# Patient Record
Sex: Male | Born: 1955 | Race: White | Hispanic: No | Smoking: Never smoker
Health system: Southern US, Community
[De-identification: ages and names within clinical notes are randomized; demographics above are authoritative.]

## PROBLEM LIST (undated history)

## (undated) DIAGNOSIS — K7581 Nonalcoholic steatohepatitis (NASH): Secondary | ICD-10-CM

## (undated) DIAGNOSIS — K279 Peptic ulcer, site unspecified, unspecified as acute or chronic, without hemorrhage or perforation: Secondary | ICD-10-CM

## (undated) DIAGNOSIS — E785 Hyperlipidemia, unspecified: Secondary | ICD-10-CM

## (undated) DIAGNOSIS — D649 Anemia, unspecified: Secondary | ICD-10-CM

## (undated) HISTORY — DX: Anemia, unspecified: D64.9

## (undated) HISTORY — DX: Nonalcoholic steatohepatitis (NASH): K75.81

## (undated) HISTORY — PX: TONSILLECTOMY: SUR1361

## (undated) HISTORY — PX: NASAL SEPTUM SURGERY: SHX37

## (undated) HISTORY — DX: Hyperlipidemia, unspecified: E78.5

## (undated) HISTORY — DX: Peptic ulcer, site unspecified, unspecified as acute or chronic, without hemorrhage or perforation: K27.9

## (undated) HISTORY — PX: UPPER GASTROINTESTINAL ENDOSCOPY: SHX188

---

## 1992-05-25 DIAGNOSIS — K279 Peptic ulcer, site unspecified, unspecified as acute or chronic, without hemorrhage or perforation: Secondary | ICD-10-CM

## 1992-05-25 HISTORY — DX: Peptic ulcer, site unspecified, unspecified as acute or chronic, without hemorrhage or perforation: K27.9

## 1992-05-25 HISTORY — PX: COLONOSCOPY: SHX174

## 1993-05-25 DIAGNOSIS — D649 Anemia, unspecified: Secondary | ICD-10-CM

## 1993-05-25 HISTORY — DX: Anemia, unspecified: D64.9

## 2004-04-08 ENCOUNTER — Ambulatory Visit: Payer: Self-pay | Admitting: Internal Medicine

## 2004-04-24 ENCOUNTER — Ambulatory Visit: Payer: Self-pay | Admitting: Internal Medicine

## 2004-05-02 ENCOUNTER — Ambulatory Visit: Payer: Self-pay | Admitting: Internal Medicine

## 2004-05-23 ENCOUNTER — Ambulatory Visit: Payer: Self-pay | Admitting: Family Medicine

## 2004-07-07 ENCOUNTER — Ambulatory Visit: Payer: Self-pay | Admitting: Internal Medicine

## 2004-07-11 ENCOUNTER — Ambulatory Visit: Payer: Self-pay | Admitting: Internal Medicine

## 2004-07-18 ENCOUNTER — Encounter: Admission: RE | Admit: 2004-07-18 | Discharge: 2004-07-18 | Payer: Self-pay | Admitting: Internal Medicine

## 2004-12-15 ENCOUNTER — Ambulatory Visit: Payer: Self-pay | Admitting: Internal Medicine

## 2005-01-21 ENCOUNTER — Ambulatory Visit: Payer: Self-pay | Admitting: Internal Medicine

## 2005-01-22 ENCOUNTER — Encounter: Admission: RE | Admit: 2005-01-22 | Discharge: 2005-01-22 | Payer: Self-pay | Admitting: Internal Medicine

## 2005-03-19 ENCOUNTER — Ambulatory Visit: Payer: Self-pay | Admitting: Internal Medicine

## 2005-03-23 ENCOUNTER — Ambulatory Visit: Payer: Self-pay | Admitting: Internal Medicine

## 2005-04-13 ENCOUNTER — Ambulatory Visit: Payer: Self-pay | Admitting: Internal Medicine

## 2005-04-28 ENCOUNTER — Encounter: Admission: RE | Admit: 2005-04-28 | Discharge: 2005-07-27 | Payer: Self-pay | Admitting: Internal Medicine

## 2005-05-25 HISTORY — PX: COLONOSCOPY: SHX174

## 2005-07-13 ENCOUNTER — Ambulatory Visit: Payer: Self-pay | Admitting: Internal Medicine

## 2005-08-31 ENCOUNTER — Ambulatory Visit: Payer: Self-pay | Admitting: Internal Medicine

## 2005-09-04 ENCOUNTER — Emergency Department (HOSPITAL_COMMUNITY): Admission: EM | Admit: 2005-09-04 | Discharge: 2005-09-04 | Payer: Self-pay | Admitting: Emergency Medicine

## 2005-09-23 ENCOUNTER — Ambulatory Visit: Payer: Self-pay | Admitting: Gastroenterology

## 2005-09-30 ENCOUNTER — Ambulatory Visit: Payer: Self-pay | Admitting: Gastroenterology

## 2005-10-07 ENCOUNTER — Ambulatory Visit: Payer: Self-pay | Admitting: Gastroenterology

## 2005-10-07 ENCOUNTER — Encounter (INDEPENDENT_AMBULATORY_CARE_PROVIDER_SITE_OTHER): Payer: Self-pay | Admitting: *Deleted

## 2005-11-02 ENCOUNTER — Ambulatory Visit: Payer: Self-pay | Admitting: Internal Medicine

## 2005-11-18 ENCOUNTER — Encounter: Admission: RE | Admit: 2005-11-18 | Discharge: 2006-02-16 | Payer: Self-pay | Admitting: Internal Medicine

## 2005-11-24 ENCOUNTER — Ambulatory Visit: Payer: Self-pay | Admitting: Internal Medicine

## 2005-12-23 ENCOUNTER — Ambulatory Visit: Payer: Self-pay | Admitting: Gastroenterology

## 2006-01-26 ENCOUNTER — Ambulatory Visit: Payer: Self-pay | Admitting: Internal Medicine

## 2006-03-10 ENCOUNTER — Ambulatory Visit: Payer: Self-pay | Admitting: Internal Medicine

## 2006-03-19 ENCOUNTER — Ambulatory Visit: Payer: Self-pay | Admitting: Internal Medicine

## 2006-04-01 ENCOUNTER — Ambulatory Visit: Payer: Self-pay | Admitting: Internal Medicine

## 2006-05-04 ENCOUNTER — Ambulatory Visit: Payer: Self-pay | Admitting: Internal Medicine

## 2006-05-04 LAB — CONVERTED CEMR LAB
ALT: 38 units/L (ref 0–40)
Chol/HDL Ratio, serum: 3.8
Triglyceride fasting, serum: 111 mg/dL (ref 0–149)
VLDL: 22 mg/dL (ref 0–40)

## 2006-09-03 ENCOUNTER — Ambulatory Visit: Payer: Self-pay | Admitting: Internal Medicine

## 2007-01-10 ENCOUNTER — Ambulatory Visit: Payer: Self-pay | Admitting: Internal Medicine

## 2007-01-10 DIAGNOSIS — K900 Celiac disease: Secondary | ICD-10-CM

## 2007-01-14 ENCOUNTER — Ambulatory Visit: Payer: Self-pay | Admitting: Internal Medicine

## 2007-01-24 LAB — CONVERTED CEMR LAB
ALT: 49 units/L (ref 0–53)
AST: 35 units/L (ref 0–37)
Alkaline Phosphatase: 51 units/L (ref 39–117)
Basophils Relative: 0.4 % (ref 0.0–1.0)
Bilirubin, Direct: 0.1 mg/dL (ref 0.0–0.3)
CO2: 26 meq/L (ref 19–32)
Calcium: 9.2 mg/dL (ref 8.4–10.5)
Creatinine, Ser: 1 mg/dL (ref 0.4–1.5)
Eosinophils Relative: 10.2 % — ABNORMAL HIGH (ref 0.0–5.0)
GFR calc Af Amer: 101 mL/min
Glucose, Bld: 91 mg/dL (ref 70–99)
Hemoglobin: 16.5 g/dL (ref 13.0–17.0)
LDL Cholesterol: 79 mg/dL (ref 0–99)
Lymphocytes Relative: 23.7 % (ref 12.0–46.0)
Neutro Abs: 3.4 10*3/uL (ref 1.4–7.7)
Platelets: 274 10*3/uL (ref 150–400)
RDW: 12.3 % (ref 11.5–14.6)
Total Bilirubin: 1.8 mg/dL — ABNORMAL HIGH (ref 0.3–1.2)
Total Protein: 7.2 g/dL (ref 6.0–8.3)
Triglycerides: 77 mg/dL (ref 0–149)
VLDL: 15 mg/dL (ref 0–40)
WBC: 6.2 10*3/uL (ref 4.5–10.5)

## 2007-01-25 ENCOUNTER — Encounter (INDEPENDENT_AMBULATORY_CARE_PROVIDER_SITE_OTHER): Payer: Self-pay | Admitting: *Deleted

## 2007-02-01 ENCOUNTER — Encounter: Payer: Self-pay | Admitting: Internal Medicine

## 2007-05-23 ENCOUNTER — Ambulatory Visit: Payer: Self-pay | Admitting: Internal Medicine

## 2007-05-24 ENCOUNTER — Telehealth: Payer: Self-pay | Admitting: Internal Medicine

## 2007-05-25 ENCOUNTER — Telehealth: Payer: Self-pay | Admitting: Internal Medicine

## 2007-05-25 LAB — CONVERTED CEMR LAB
Amylase: 76 units/L (ref 27–131)
Basophils Absolute: 0 10*3/uL (ref 0.0–0.1)
Eosinophils Absolute: 0.2 10*3/uL (ref 0.0–0.6)
H Pylori IgG: NEGATIVE
Lipase: 38 units/L (ref 11.0–59.0)
Lymphocytes Relative: 19.8 % (ref 12.0–46.0)
MCHC: 36 g/dL (ref 30.0–36.0)
MCV: 87.4 fL (ref 78.0–100.0)
Monocytes Relative: 10.8 % (ref 3.0–11.0)
Neutro Abs: 5.8 10*3/uL (ref 1.4–7.7)
Platelets: 306 10*3/uL (ref 150–400)
RBC: 4.79 M/uL (ref 4.22–5.81)
WBC: 8.6 10*3/uL (ref 4.5–10.5)

## 2007-05-30 ENCOUNTER — Encounter (INDEPENDENT_AMBULATORY_CARE_PROVIDER_SITE_OTHER): Payer: Self-pay | Admitting: *Deleted

## 2007-05-30 ENCOUNTER — Ambulatory Visit: Payer: Self-pay | Admitting: Internal Medicine

## 2007-06-17 ENCOUNTER — Encounter: Payer: Self-pay | Admitting: Internal Medicine

## 2007-10-27 ENCOUNTER — Telehealth (INDEPENDENT_AMBULATORY_CARE_PROVIDER_SITE_OTHER): Payer: Self-pay | Admitting: *Deleted

## 2007-12-20 ENCOUNTER — Emergency Department (HOSPITAL_BASED_OUTPATIENT_CLINIC_OR_DEPARTMENT_OTHER): Admission: EM | Admit: 2007-12-20 | Discharge: 2007-12-20 | Payer: Self-pay | Admitting: Emergency Medicine

## 2007-12-28 ENCOUNTER — Ambulatory Visit: Payer: Self-pay | Admitting: Internal Medicine

## 2007-12-28 LAB — CONVERTED CEMR LAB: HDL goal, serum: 40 mg/dL

## 2007-12-29 ENCOUNTER — Encounter (INDEPENDENT_AMBULATORY_CARE_PROVIDER_SITE_OTHER): Payer: Self-pay | Admitting: *Deleted

## 2008-02-24 ENCOUNTER — Ambulatory Visit: Payer: Self-pay | Admitting: Internal Medicine

## 2008-02-29 ENCOUNTER — Telehealth (INDEPENDENT_AMBULATORY_CARE_PROVIDER_SITE_OTHER): Payer: Self-pay | Admitting: *Deleted

## 2008-07-17 ENCOUNTER — Ambulatory Visit: Payer: Self-pay | Admitting: Internal Medicine

## 2008-12-07 ENCOUNTER — Ambulatory Visit: Payer: Self-pay | Admitting: Family Medicine

## 2008-12-17 ENCOUNTER — Ambulatory Visit: Payer: Self-pay | Admitting: Sports Medicine

## 2008-12-18 ENCOUNTER — Encounter: Payer: Self-pay | Admitting: Sports Medicine

## 2008-12-27 ENCOUNTER — Encounter: Payer: Self-pay | Admitting: Internal Medicine

## 2009-02-21 ENCOUNTER — Telehealth (INDEPENDENT_AMBULATORY_CARE_PROVIDER_SITE_OTHER): Payer: Self-pay | Admitting: *Deleted

## 2009-02-21 ENCOUNTER — Ambulatory Visit: Payer: Self-pay | Admitting: Internal Medicine

## 2009-02-22 ENCOUNTER — Encounter: Payer: Self-pay | Admitting: Internal Medicine

## 2009-02-28 ENCOUNTER — Telehealth (INDEPENDENT_AMBULATORY_CARE_PROVIDER_SITE_OTHER): Payer: Self-pay | Admitting: *Deleted

## 2009-03-14 ENCOUNTER — Ambulatory Visit: Payer: Self-pay | Admitting: Sports Medicine

## 2009-04-04 ENCOUNTER — Ambulatory Visit: Payer: Self-pay | Admitting: Sports Medicine

## 2009-05-28 ENCOUNTER — Ambulatory Visit: Payer: Self-pay | Admitting: Internal Medicine

## 2009-06-20 ENCOUNTER — Ambulatory Visit: Payer: Self-pay | Admitting: Internal Medicine

## 2009-06-20 DIAGNOSIS — M109 Gout, unspecified: Secondary | ICD-10-CM

## 2009-06-20 DIAGNOSIS — R740 Nonspecific elevation of levels of transaminase and lactic acid dehydrogenase [LDH]: Secondary | ICD-10-CM

## 2009-06-20 DIAGNOSIS — J45909 Unspecified asthma, uncomplicated: Secondary | ICD-10-CM

## 2009-06-20 DIAGNOSIS — R7402 Elevation of levels of lactic acid dehydrogenase (LDH): Secondary | ICD-10-CM | POA: Insufficient documentation

## 2009-06-20 DIAGNOSIS — E785 Hyperlipidemia, unspecified: Secondary | ICD-10-CM

## 2009-07-18 ENCOUNTER — Ambulatory Visit: Payer: Self-pay | Admitting: Family Medicine

## 2009-09-26 ENCOUNTER — Ambulatory Visit: Payer: Self-pay | Admitting: Internal Medicine

## 2009-09-26 LAB — CONVERTED CEMR LAB
Albumin: 4.4 g/dL (ref 3.5–5.2)
Cholesterol: 155 mg/dL (ref 0–200)
HDL: 33.2 mg/dL — ABNORMAL LOW (ref >39.00)
Total Protein: 7 g/dL (ref 6.0–8.3)
Triglycerides: 71 mg/dL (ref 0.0–149.0)
Uric Acid, Serum: 5.2 mg/dL (ref 4.0–7.8)

## 2009-10-02 ENCOUNTER — Ambulatory Visit: Payer: Self-pay | Admitting: Internal Medicine

## 2010-01-30 ENCOUNTER — Ambulatory Visit: Payer: Self-pay | Admitting: Internal Medicine

## 2010-03-17 ENCOUNTER — Ambulatory Visit: Payer: Self-pay | Admitting: Sports Medicine

## 2010-03-17 DIAGNOSIS — S4350XA Sprain of unspecified acromioclavicular joint, initial encounter: Secondary | ICD-10-CM | POA: Insufficient documentation

## 2010-03-31 ENCOUNTER — Ambulatory Visit: Payer: Self-pay | Admitting: Family Medicine

## 2010-04-07 ENCOUNTER — Encounter (INDEPENDENT_AMBULATORY_CARE_PROVIDER_SITE_OTHER): Payer: Self-pay | Admitting: *Deleted

## 2010-05-12 ENCOUNTER — Ambulatory Visit: Payer: Self-pay | Admitting: Internal Medicine

## 2010-05-16 ENCOUNTER — Telehealth: Payer: Self-pay | Admitting: Internal Medicine

## 2010-05-28 ENCOUNTER — Encounter: Payer: Self-pay | Admitting: Family Medicine

## 2010-06-19 ENCOUNTER — Ambulatory Visit
Admission: RE | Admit: 2010-06-19 | Discharge: 2010-06-19 | Payer: Self-pay | Source: Home / Self Care | Attending: Family Medicine | Admitting: Family Medicine

## 2010-06-22 LAB — CONVERTED CEMR LAB
ALT: 79 units/L — ABNORMAL HIGH (ref 0–53)
Alkaline Phosphatase: 53 units/L (ref 39–117)
BUN: 15 mg/dL (ref 6–23)
Basophils Absolute: 0.1 10*3/uL (ref 0.0–0.1)
Bilirubin, Direct: 0.1 mg/dL (ref 0.0–0.3)
Chloride: 105 meq/L (ref 96–112)
Creatinine, Ser: 1.1 mg/dL (ref 0.4–1.5)
Eosinophils Absolute: 0.3 10*3/uL (ref 0.0–0.7)
GFR calc non Af Amer: 74.21 mL/min (ref >60)
Glucose, Bld: 85 mg/dL (ref 70–99)
HCT: 48.3 % (ref 39.0–52.0)
Hemoglobin: 15.8 g/dL (ref 13.0–17.0)
Lymphs Abs: 1.9 10*3/uL (ref 0.7–4.0)
MCHC: 32.7 g/dL (ref 30.0–36.0)
Neutro Abs: 3.9 10*3/uL (ref 1.4–7.7)
PSA: 0.66 ng/mL (ref 0.10–4.00)
Platelets: 274 10*3/uL (ref 150.0–400.0)
Potassium: 3.7 meq/L (ref 3.5–5.1)
RDW: 12.7 % (ref 11.5–14.6)
TSH: 2.23 microintl units/mL (ref 0.35–5.50)
Total Bilirubin: 1.9 mg/dL — ABNORMAL HIGH (ref 0.3–1.2)
Total Protein: 7.3 g/dL (ref 6.0–8.3)

## 2010-06-24 NOTE — Assessment & Plan Note (Signed)
Summary: FU SEPARATED SHOULDER/MJD   Vital Signs:  Patient profile:   55 year old male Pulse rate:   98 / minute BP sitting:   132 / 91  (right arm)  Vitals Entered By: Rochele Pages RN (March 31, 2010 3:55 PM) CC: f/u right separated shoulder   Primary Care Provider:  Alwyn Ren  CC:  f/u right separated shoulder.  History of Present Illness: Right shoulder injury (AC separation) Has been wearing sling 80%  of time and is 75% better. Still not sleeping really well at night. usually takes 2 otc ibuprofen, if he tales a larger dose or more often he gets GI upset.   Swelling and bruising resolved--using the arm some.  Habits & Providers  Alcohol-Tobacco-Diet     Tobacco Status: never  Allergies: No Known Drug Allergies  Review of Systems  The patient denies fever.    Physical Exam  General:  alert and well-developed.   Additional Exam:  RIGHT shoulder TTP AC joint. no bruising or warmth. FROM of shooulder and intact strength all plabnes rotator cuff.  Distally neurovascularly intact. Small amount swelling right over [facility] joint but otherwise clavicles are symmetrical.   Impression & Recommendations:  Problem # 1:  ACROMIOCLAVICULAR SPRAIN AND STRAIN (ICD-840.0) start to wean out of sling, tramodol for night pain. Will set up PT beginning 7 days from now--want to get full healing before stressing it. RTC as needed.  Complete Medication List: 1)  Asa 81mg   2)  Vit C Bid  3)  Allopurinol 300 Mg Tabs (Allopurinol) .Marland Kitchen.. 1 once daily 4)  Singulair 10 Mg Tabs (Montelukast sodium) .Marland Kitchen.. 1 by mouth once daily as needed 5)  Advair Diskus 500-50 Mcg/dose Aepb (Fluticasone-salmeterol) .... As needed 6)  Ventolin Hfa 108 (90 Base) Mcg/act Aers (Albuterol sulfate) .... As needed 7)  Colcrys 0.6 Mg Tabs (Colchicine) .Marland Kitchen.. 1 two times a day as needed 8)  Tramadol Hcl 50 Mg Tabs (Tramadol hcl) .... 40 Prescriptions: TRAMADOL HCL 50 MG TABS (TRAMADOL HCL) 40  #1-2 po qhs x 0   Entered and  Authorized by:   Denny Levy MD   Signed by:   Denny Levy MD on 04/01/2010   Method used:   Handwritten   RxID:   [id number]    Orders Added: 1)  Est. Patient Level III [30865]

## 2010-06-24 NOTE — Letter (Signed)
Summary: Cancer Screening/[facility] Personalized Risk Profile  Cancer Screening/[facility] Personalized Risk Profile   Imported By: Lanelle Bal 06/27/2009 13:51:11  _____________________________________________________________________  External Attachment:    Type:   Image     Comment:   External Document

## 2010-06-24 NOTE — Assessment & Plan Note (Signed)
Summary: bumps on penis/cbs   Vital Signs:  Patient profile:   55 year old male Weight:      170.4 pounds Temp:     98.4 degrees F oral Pulse rate:   76 / minute Resp:     14 per minute BP sitting:   110 / 70  (left arm) Cuff size:   large  Vitals Entered By: Shonna Chock CMA (January 30, 2010 8:14 AM) CC: Examine below waist   Primary Care Provider:  Alwyn Ren  CC:  Examine below waist.  History of Present Illness: Papules which have appeared in  past 2 months , more noticable in past couple of weeks. One has increased in size & is  more firm. No change in color ; no associated constitutional symptoms. No PMH of  skin cancer; his mother had melanoma.  Current Medications (verified): 1)  Asa 81mg  2)  Vit C Bid 3)  Allopurinol 300 Mg  Tabs (Allopurinol) .Marland Kitchen.. 1 Once Daily 4)  Singulair 10 Mg Tabs (Montelukast Sodium) .Marland Kitchen.. 1 By Mouth Once Daily As Needed 5)  Advair Diskus 500-50 Mcg/dose Aepb (Fluticasone-Salmeterol) .... As Needed 6)  Ventolin Hfa 108 (90 Base) Mcg/act Aers (Albuterol Sulfate) .... As Needed 7)  Colcrys 0.6 Mg Tabs (Colchicine) .Marland Kitchen.. 1 Two Times A Day As Needed  Allergies (verified): No Known Drug Allergies  Review of Systems General:  Denies chills, fever, sweats, and weight loss. Derm:  Denies changes in color of skin and itching.  Physical Exam  General:  well-nourished; alert,appropriate and cooperative throughout examination Abdomen:  Bowel sounds positive,abdomen soft and non-tender without masses, organomegaly or hernias noted. Genitalia:   No penis lesions or urethral discharge other than papule. Skin:  Small papule on penile shaft w/o color change. Tiny white papule also Cervical Nodes:  No lymphadenopathy noted Axillary Nodes:  No palpable lymphadenopathy Inguinal Nodes:  No significant adenopathy   Impression & Recommendations:  Problem # 1:  OTHER SPECIFIED DISORDER OF SKIN (ICD-709.8) inspissated pore  Complete Medication List: 1)   Asa 81mg   2)  Vit C Bid  3)  Allopurinol 300 Mg Tabs (Allopurinol) .Marland Kitchen.. 1 once daily 4)  Singulair 10 Mg Tabs (Montelukast sodium) .Marland Kitchen.. 1 by mouth once daily as needed 5)  Advair Diskus 500-50 Mcg/dose Aepb (Fluticasone-salmeterol) .... As needed 6)  Ventolin Hfa 108 (90 Base) Mcg/act Aers (Albuterol sulfate) .... As needed 7)  Colcrys 0.6 Mg Tabs (Colchicine) .Marland Kitchen.. 1 two times a day as needed  Patient Instructions: 1)  Report change in color or size of lesion or associated signs of secondary infection.

## 2010-06-24 NOTE — Assessment & Plan Note (Signed)
Summary: CPX//PH   Vital Signs:  Patient profile:   55 year old male Height:      70.5 inches Weight:      184.8 pounds BMI:     26.24 Temp:     98.3 degrees F oral Pulse rate:   87 / minute Resp:     14 per minute BP sitting:   141 / 88  (left arm)  Vitals Entered By: Doristine Devoid (June 20, 2009 1:38 PM)  CC:  CPX, General Medical Evaluation   CC:   CPX and General Medical Evaluation.  History of Present Illness: Mr Lanigan is here  for a physical ; he is asymptomatic.  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 1995 asthma as child, essential  resolution in teens Hyperlipidemia: 97(1648/620)HDL 37,TG 86 Gout NASH, PMH of  Past Surgical History: Deviated septum repair;  Endoscopy: HH, gastritis 2007; PUD @ [facility] 38 ([place])  Family History: Father: HTN, MI @ 64, CABG, gout Mother: breast CA Siblings: none ; Paternal FH depression; Maternal FH alcoholism  Social History: 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         Weighgt loss of 15# over past  year with TLC. Resp:  Denies cough, shortness of breath, and sputum productive; Rare wheezing , esp with cold (EIB). MDI rarely used. No recurrent PNDyspnea.. MS:  Complains of stiffness; denies joint pain, joint redness, joint swelling, low back pain, mid  back pain, and thoracic pain; Last gout flare in 2010.  Physical Exam  General:  Thin but well-nourished,in no acute distress; alert,appropriate and cooperative throughout examination Head:  Normocephalic and atraumatic without obvious abnormalities. No apparent alopecia . Moustache & goatee Eyes:  No corneal or conjunctival inflammation noted. Perrla. Funduscopic exam benign, without hemorrhages, exudates or papilledema.  Ears:  External ear exam shows no significant lesions or deformities.  Otoscopic examination reveals clear canals, tympanic membranes are intact bilaterally without bulging, retraction, inflammation or discharge. Hearing is grossly normal bilaterally. Nose:  External nasal examination shows no deformity or inflammation. Nasal mucosa are pink and moist without lesions or exudates. Mouth:  Oral mucosa and oropharynx without lesions or exudates.  Teeth in good repair. Neck:  No deformities, masses, or tenderness noted. R lobe > L w/o nodules Lungs:  Normal respiratory effort, chest expands symmetrically. Lungs are clear to auscultation, no crackles or wheezes. Heart:  Normal rate and regular rhythm. S1 and S2 normal without gallop, murmur, click, rub. S4 Abdomen:  Bowel sounds positive,abdomen soft and non-tender without masses, organomegaly or hernias noted. Rectal:  No external abnormalities noted. Normal sphincter tone. No rectal masses or tenderness. Genitalia:  Testes bilaterally descended without  tenderness or masses.  No penis lesions or urethral discharge. Minor granuloma R epididymal area Prostate:  Prostate gland firm and smooth, no enlargement, nodularity, tenderness, mass, asymmetry or induration. Msk:  No deformity or scoliosis noted of thoracic or lumbar spine.   Pulses:  R and L carotid,radial,dorsalis pedis and posterior  tibial pulses are full and equal bilaterally Extremities:  No clubbing, cyanosis, edema, or deformity noted with normal full range of motion of  all joints.  Olecranon bursa granulomatous changes , R > L Neurologic:  alert & oriented X3, gait normal, and DTRs symmetrical and normal.   Skin:  Intact without suspicious lesions or rashes Cervical Nodes:  No lymphadenopathy noted Axillary Nodes:  No palpable lymphadenopathy Inguinal Nodes:  No significant adenopathy Psych:  memory intact for recent and remote, normally interactive, and good eye contact.     Impression & Recommendations:  Problem # 1:  ROUTINE GENERAL MEDICAL EXAM@HEALTH  CARE FACL (ICD-V70.0)  Orders: EKG w/ Interpretation (93000)  Problem # 2:  HYPERLIPIDEMIA (ICD-272.4)  His updated medication list for this problem includes:    Vytorin 10-20 Mg Tabs (Ezetimibe-simvastatin) .Marland Kitchen... 1/2 tab qd  Orders: EKG w/ Interpretation (93000)  Problem # 3:  GOUT (ICD-274.9)  His updated medication list for this problem includes:    Allopurinol 300 Mg Tabs (Allopurinol) .Marland Kitchen... 1 once daily    Colchicine 0.6 Mg Tabs (Colchicine) .Marland Kitchen... Take one tablet every 4 hours as needed  Problem # 4:  CELIAC SPRUE (ICD-579.0)  Problem # 5:  GILBERT'S SYNDROME (ICD-277.4)  Problem # 6:  NONSPEC ELEVATION OF LEVELS OF TRANSAMINASE/LDH (ICD-790.4) PMH of NASH  Problem # 7:  ASTHMA, UNSPECIFIED, UNSPECIFIED STATUS (ICD-493.90) quiescent His updated medication list for this problem includes:    Singulair 10 Mg Tabs (Montelukast sodium) .Marland Kitchen... 1 by mouth once daily as needed    Qvar 80 Mcg/act Aers (Beclomethasone dipropionate) .Marland Kitchen... 1-2 puffs q 12 hrs ; gargle after use  Complete Medication List: 1)  Vytorin 10-20 Mg Tabs (Ezetimibe-simvastatin) .... 1/2 tab qd 2)  Asa 81mg   3)  Vit C Bid  4)  Allopurinol 300 Mg Tabs (Allopurinol) .Marland Kitchen.. 1 once daily 5)  Singulair 10 Mg Tabs (Montelukast sodium) .Marland Kitchen.. 1 by mouth once daily as needed 6)  Colchicine 0.6 Mg Tabs (Colchicine) .... Take one tablet every 4 hours as needed 7)  Fexofenadine Hcl 180 Mg Tabs (Fexofenadine hcl) .Marland Kitchen.. 1 once daily  as needed allergic symptoms 8)  Qvar 80 Mcg/act Aers (Beclomethasone dipropionate) .Marland Kitchen.. 1-2 puffs q 12 hrs ; gargle after use  Patient Instructions: 1)  Avoid vit A > 5000 units/ day. Complex carb ingestion instead of white carbs. Avoid ALL foods & drinks with High Fructose Corn Syrup as #1,2 or #3 on label. 2)  Please schedule a follow-up appointment in 4-6  months. 3)  Hepatic Panel, uric acid;Lipid Panel . Minimal LDL goal = < 100, ideally < 80 Prescriptions: ALLOPURINOL 300 MG  TABS (ALLOPURINOL) 1 once daily  #90 x 1   Entered and Authorized by:   Marga Melnick MD   Signed by:   Marga Melnick MD on 06/20/2009   Method used:   Faxed to ...       [facility] [address] (retail)       [address]       [place], [place]  [address]       Ph: [phone] or [phone]       Fax: [phone]   RxID:   [id number]    Immunization History:  Tetanus/Td Immunization History:    Tetanus/Td:  historical (04/01/2006)

## 2010-06-24 NOTE — Assessment & Plan Note (Signed)
Summary: wheezing/cough/kdc   Vital Signs:  Patient profile:   55 year old male Weight:      188 pounds O2 Sat:      96 % Temp:     98.1 degrees F oral Pulse rate:   119 / minute BP sitting:   138 / 86  (left arm) Cuff size:   large  Vitals Entered By: Shonna Chock (July 18, 2009 2:03 PM) CC: Cough & Wheezing x 1 week Comments REVIEWED MED LIST, PATIENT AGREED DOSE AND INSTRUCTION CORRECT    History of Present Illness:  Cough      This is a 55 year old man who presents with Cough.  The symptoms began 1 week ago.  The patient reports productive cough, wheezing, and exertional dyspnea, but denies non-productive cough, pleuritic chest pain, shortness of breath, fever, hemoptysis, and malaise.  Associated symtpoms include nasal congestion.  The patient denies the following symptoms: cold/URI symptoms, sore throat, chronic rhinitis, weight loss, acid reflux symptoms, and peripheral edema.  The cough is worse with exercise.  Ineffective prior treatments have included albuterol inhaler and other asthma medication.  Pt is using mucinex with some relief.  Pt using albuterol 2 x a day and advair 500 2x a day.      Allergies (verified): No Known Drug Allergies  Past History:  Past medical, surgical, family and social histories (including risk factors) reviewed for relevance to current acute and chronic problems.  Past Medical History: Reviewed history from 06/20/2009 and no changes required. 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 Deviated septum repair;  Endoscopy: HH, gastritis 2007; PUD @ Endo 1994 ([place])  Family History: Reviewed history from 06/20/2009 and no changes required. Father: HTN, MI @ 42, CABG, gout Mother: breast CA Siblings: none ; Paternal FH depression; Maternal  FH alcoholism  Social History: Reviewed history from 06/20/2009 and no changes required. 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  Otoscopic examination reveals clear canals, tympanic membranes are intact bilaterally without bulging, retraction, inflammation or discharge. Hearing is grossly normal bilaterally. Nose:  External nasal examination shows no deformity or inflammation. Nasal mucosa are pink and moist without lesions or exudates. Mouth:  Oral mucosa and oropharynx without lesions or exudates.  Teeth in good repair. Neck:  No deformities, masses, or tenderness noted. Lungs:  R decreased breath sounds and L decreased breath sounds.  ---improved with neb Heart:  Normal rate and regular rhythm. S1 and S2 normal without gallop, murmur, click, rub or other extra sounds. Cervical Nodes:  No lymphadenopathy noted Psych:  Cognition and judgment appear intact. Alert and cooperative with normal attention span and concentration. No apparent delusions, illusions, hallucinations   Impression & Recommendations:  Problem # 1:  BRONCHITIS- ACUTE (ICD-466.0)  The following medications were removed from the medication list:    Qvar 80 Mcg/act Aers (Beclomethasone dipropionate) .Marland Kitchen... 1-2 puffs q 12 hrs ; gargle after use His updated medication list for this problem includes:    Singulair 10 Mg Tabs (Montelukast sodium) .Marland Kitchen... 1 by mouth once daily as needed  Advair Diskus 500-50 Mcg/dose Aepb (Fluticasone-salmeterol) .Marland Kitchen... As needed    Ventolin Hfa 108 (90 Base) Mcg/act Aers (Albuterol sulfate) .Marland Kitchen... As needed    Zithromax Z-pak 250 Mg Tabs (Azithromycin) .Marland Kitchen... As directed  Take  antibiotics and other medications as directed. Encouraged to push clear liquids, get enough rest, and take acetaminophen as needed. To be seen in 5-7 days if no improvement, sooner if worse.  Complete Medication List: 1)  Vytorin 10-20 Mg Tabs (Ezetimibe-simvastatin) .... 1/2 tab qd 2)  Asa 81mg   3)  Vit C Bid  4)  Allopurinol 300 Mg Tabs (Allopurinol) .Marland Kitchen.. 1 once daily 5)  Singulair 10 Mg Tabs (Montelukast sodium) .Marland Kitchen.. 1 by mouth once daily as needed 6)  Colchicine 0.6 Mg Tabs (Colchicine) .... Take one tablet every 4 hours as needed 7)  Fexofenadine Hcl 180 Mg Tabs (Fexofenadine hcl) .Marland Kitchen.. 1 once daily as needed allergic symptoms 8)  Advair Diskus 500-50 Mcg/dose Aepb (Fluticasone-salmeterol) .... As needed 9)  Ventolin Hfa 108 (90 Base) Mcg/act Aers (Albuterol sulfate) .... As needed 10)  Zithromax Z-pak 250 Mg Tabs (Azithromycin) .... As directed 11)  Prednisone 10 Mg Tabs (Prednisone) .... 3 by mouth once daily for 3 days then 2 by mouth once daily for 3 days then 1 by mouth once daily for 3 days  Other Orders: Admin of Therapeutic Inj  intramuscular or subcutaneous (30865) Depo- Medrol 80mg  (J1040) Nebulizer Tx (78469) Albuterol Sulfate Sol 1mg  unit dose (G2952) Prescriptions: PREDNISONE 10 MG TABS (PREDNISONE) 3 by mouth once daily for 3 days then 2 by mouth once daily for 3 days then 1 by mouth once daily for 3 days  #18 x 0   Entered and Authorized by:   Loreen Freud DO   Signed by:   Loreen Freud DO on 07/18/2009   Method used:   Electronically to        [facility] [phone] ([facility]       [address]       [place], [place]  [address]       Ph: [phone] or [phone]       Fax: [phone]   RxID:   [id number] ZITHROMAX Z-PAK 250 MG TABS (AZITHROMYCIN) as directed  #1 x 0   Entered and Authorized by:   Loreen Freud DO   Signed by:   Loreen Freud DO on 07/18/2009   Method used:   Electronically to        [facility] [phone]* (retail)       [address]        [place], [place]  [address]       Ph: [phone] or [phone]       Fax: [phone]   RxID:   [id number] VENTOLIN HFA 108 (90 BASE) MCG/ACT AERS (ALBUTEROL SULFATE) as needed  #1 x 1   Entered and Authorized by:   Loreen Freud DO   Signed by:   Loreen Freud DO on 07/18/2009   Method used:   Electronically to        [facility] [phone][facility] (retail)       [address]       [place], [place]  [address]       Ph: [phone] or [phone]       Fax: [phone]   RxID:   [id number] ADVAIR DISKUS 500-50 MCG/DOSE AEPB (FLUTICASONE-SALMETEROL) as needed  #1 x 2   Entered and Authorized by:   Loreen Freud DO   Signed  by:   Loreen Freud DO on 07/18/2009   Method used:   Electronically to        [facility] [phone][facility] (retail)       [address]       [place], [place]  [address]       Ph: [phone] or [phone]       Fax: [phone]   RxID:   [id number]      Medication Administration  Injection # 1:    Medication: Depo- Medrol 80mg     Diagnosis: ASTHMA, UNSPECIFIED, UNSPECIFIED STATUS (ICD-493.90)    Route: IM    Site: RUOQ gluteus    Exp Date: 03/2010    Lot #: [id number]    Mfr: novaplus    Patient tolerated injection without complications    Given by: Army Fossa CMA (July 18, 2009 2:27 PM)  Medication # 1:    Medication: Albuterol Sulfate Sol 1mg  unit dose    Diagnosis: ASTHMA, UNSPECIFIED, UNSPECIFIED STATUS (ICD-493.90)  Orders Added: 1)  Admin of Therapeutic Inj  intramuscular or subcutaneous [96372] 2)  Depo- Medrol 80mg  [J1040] 3)  Nebulizer Tx [59563] 4)  Albuterol Sulfate Sol 1mg  unit dose [J7613] 5)  Est. Patient Level IV [87564]

## 2010-06-24 NOTE — Letter (Signed)
Summary: [facility] PT referral form  [facility] PT referral form   Imported By: Marily Memos 04/01/2010 11:38:21  _____________________________________________________________________  External Attachment:    Type:   Image     Comment:   External Document

## 2010-06-24 NOTE — Assessment & Plan Note (Signed)
Summary: congested/cbs   Vital Signs:  Patient profile:   55 year old male Weight:      185 pounds Temp:     98.5 degrees F oral Pulse rate:   76 / minute Resp:     14 per minute BP sitting:   120 / 70  (left arm) Cuff size:   large  Vitals Entered By: Shonna Chock (Oct 02, 2009 10:47 AM) CC: 1.) Congestion/ ? Allergies x 2-3 days  2.) Discuss Labs -off Vytorin x 3 months, Lipid Management Comments REVIEWED MED LIST, PATIENT AGREED DOSE AND INSTRUCTION CORRECT    Primary Care Provider:  Alwyn Ren  CC:  1.) Congestion/ ? Allergies x 2-3 days  2.) Discuss Labs -off Vytorin x 3 months and Lipid Management.  History of Present Illness: Labs reviewed & risks discussed. ALT is now 54 & LDL 108. NMR reviewed ; LDL goal < 80 based on that study but < 130 as per [facility]. No premature MI in FH.  PMH of NASH .CVE 3-4X/week w/o symptoms.                                                                                                                                                         Onset of ST, throat  & head congestion  & NP cough as of 05/09. Rx: Dayquil, Echinacea, Nyquil. He is flying to [place] this afternoon.  Lipid Management History:      Positive NCEP/ATP III risk factors include male age 57 years old or older and HDL cholesterol less than 40.  Negative NCEP/ATP III risk factors include non-diabetic, no family history for ischemic heart disease, non-tobacco-user status, non-hypertensive, no ASHD (atherosclerotic heart disease), no prior stroke/TIA, no peripheral vascular disease, and no history of aortic aneurysm.     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 [place] LDL goal = < 130. NMR Panel goal =< 80. Gout NASH, PMH of  Review of Systems General:  Denies chills, fever, and sweats. ENT:   Complains of nasal congestion; denies ear discharge, earache, and sinus pressure; No frontal headache , facial pain or purulence. CV:  Denies chest pain or discomfort, leg cramps with exertion, and shortness of breath with exertion. Resp:  Complains of shortness of breath and wheezing; denies chest pain with inspiration and sputum productive; Advair restarted 2 days ago; using Albuterol 2-3 X/ day. Allergy:  Complains of sneezing; denies itching eyes.  Physical Exam  General:  in no acute distress; alert,appropriate and cooperative throughout examination Ears:  External ear exam shows no significant lesions or deformities.  Otoscopic examination reveals clear canals, tympanic membranes are intact bilaterally without bulging, retraction, inflammation  or discharge. Hearing is grossly normal bilaterally. Nose:  External nasal examination shows no deformity or inflammation. Nasal mucosa are dry with septal deviation to R without lesions or exudates. Mouth:  Oral mucosa and oropharynx without lesions or exudates.  Teeth in good repair.R hypophraynx lower than L Lungs:  Normal respiratory effort, chest expands symmetrically. Lungs : minimal rales &  wheezes. Heart:  regular rhythm, no murmur, no gallop, no rub, no JVD, and bradycardia.  S4 Skin:  Intact without suspicious lesions or rashes Cervical Nodes:  No lymphadenopathy noted Axillary Nodes:  No palpable lymphadenopathy   Impression & Recommendations:  Problem # 1:  BRONCHITIS-ACUTE (ICD-466.0) with mild RAD The following medications were removed from the medication list:    Zithromax Z-pak 250 Mg Tabs (Azithromycin) .Marland Kitchen... As directed His updated medication list for this problem includes:    Singulair 10 Mg Tabs (Montelukast sodium) .Marland Kitchen... 1 by mouth once daily as needed    Advair Diskus 500-50 Mcg/dose Aepb (Fluticasone-salmeterol) .Marland Kitchen... As needed    Ventolin Hfa 108 (90 Base) Mcg/act Aers (Albuterol sulfate) .Marland Kitchen... As needed     Azithromycin 250 Mg Tabs (Azithromycin) .Marland Kitchen... As per pack  Problem # 2:  URI (ICD-465.9)  The following medications were removed from the medication list:    Fexofenadine Hcl 180 Mg Tabs (Fexofenadine hcl) .Marland Kitchen... 1 once daily as needed allergic symptoms  Problem # 3:  HYPERLIPIDEMIA (ICD-272.4) LDL 108 after 3 mos off statin The following medications were removed from the medication list:    Vytorin 10-20 Mg Tabs (Ezetimibe-simvastatin) .Marland Kitchen... 1/2 tab qd  Problem # 4:  GOUT (ICD-274.9) PMH of, uric acid @ goal The following medications were removed from the medication list:    Colchicine 0.6 Mg Tabs (Colchicine) .Marland Kitchen... Take one tablet every 4 hours as needed His updated medication list for this problem includes:    Allopurinol 300 Mg Tabs (Allopurinol) .Marland Kitchen... 1 once daily    Colcrys 0.6 Mg Tabs (Colchicine) .Marland Kitchen... 1 two times a day as needed  Complete Medication List: 1)  Asa 81mg   2)  Vit C Bid  3)  Allopurinol 300 Mg Tabs (Allopurinol) .Marland Kitchen.. 1 once daily 4)  Singulair 10 Mg Tabs (Montelukast sodium) .Marland Kitchen.. 1 by mouth once daily as needed 5)  Advair Diskus 500-50 Mcg/dose Aepb (Fluticasone-salmeterol) .... As needed 6)  Ventolin Hfa 108 (90 Base) Mcg/act Aers (Albuterol sulfate) .... As needed 7)  Colcrys 0.6 Mg Tabs (Colchicine) .Marland Kitchen.. 1 two times a day as needed 8)  Azithromycin 250 Mg Tabs (Azithromycin) .... As per pack  Lipid Assessment/Plan:      Based on NCEP/ATP III, the patient's risk factor category is "2 or more risk factors and a calculated 10 year CAD risk of < 20%".  The patient's lipid goals are as follows: Total cholesterol goal is 200; LDL cholesterol goal is 75; HDL cholesterol goal is 40; Triglyceride goal is 150.  His LDL cholesterol goal has been met.    Patient Instructions: 1)  Colcrys two times a day as needed only for gout.Use Advair 500/50 two times a day; gargle & spit after use. Consume < 40 grams of sugar/ day from High Fructose Corn Syrup Sources. 2)  Please  schedule a follow-up appointment in 1 year. 3)  It is important that you exercise regularly at least 20 minutes 5 times a week. If you develop chest pain, have severe difficulty breathing, or feel very tired , stop exercising immediately and seek medical attention.Take an Aspirin every  day.Hepatic Panel prior to visit, ICD-9:794.5;uric acid : 274.9;NMR Lipoprofile Lipid Panel prior to visit, ICD-9:272.4, V17.3. Prescriptions: AZITHROMYCIN 250 MG TABS (AZITHROMYCIN) as per pack  #1 x 0   Entered and Authorized by:   Marga Melnick MD   Signed by:   Marga Melnick MD on 10/02/2009   Method used:   Print then Give to Patient   RxID:   [id number] COLCRYS 0.6 MG TABS (COLCHICINE) 1 two times a day as needed  #30 x 1   Entered and Authorized by:   Marga Melnick MD   Signed by:   Marga Melnick MD on 10/02/2009   Method used:   Print then Give to Patient   RxID:   [id number]

## 2010-06-24 NOTE — Miscellaneous (Signed)
  Clinical Lists Changes   [facility] at  [phone]; done. Lillia Pauls CMA  April 07, 2010 3:38 PM

## 2010-06-24 NOTE — Assessment & Plan Note (Signed)
Summary: 4:15 APPT,SHOULDER INJURY,MC   Vital Signs:  Patient profile:   55 year old male BP sitting:   130 / 90  Vitals Entered By: Rochele Pages RN (March 17, 2010 4:25 PM)  Primary Care Provider:  Alwyn Ren   History of Present Illness: RIGHT shoulder injury 5 dyas ago--fell onto right shoulder. Immediate pain. [facility] ([facility]) and had x ray. dx with CA joint separation. No numbness or tingling in his hand or arm. ,ppmh no prior shulder injury or surgery,  Current Medications (verified): 1)  Asa 81mg  2)  Vit C Bid 3)  Allopurinol 300 Mg  Tabs (Allopurinol) .Marland Kitchen.. 1 Once Daily 4)  Singulair 10 Mg Tabs (Montelukast Sodium) .Marland Kitchen.. 1 By Mouth Once Daily As Needed 5)  Advair Diskus 500-50 Mcg/dose Aepb (Fluticasone-Salmeterol) .... As Needed 6)  Ventolin Hfa 108 (90 Base) Mcg/act Aers (Albuterol Sulfate) .... As Needed 7)  Colcrys 0.6 Mg Tabs (Colchicine) .Marland Kitchen.. 1 Two Times A Day As Needed  Allergies: No Known Drug Allergies  Review of Systems  The patient denies fever.         Marland Kitchenp   Physical Exam  General:  alert, well-developed, and well-nourished.   Neck:  supple.   Msk:  right AC joint swollen and ttp. No redness or warmth. SHOULDER FROm, intact otator cuff muscles Distally NV intact Skin:  echy shoulder / neck area   Impression & Recommendations:  Problem # 1:  ACROMIOCLAVICULAR SPRAIN AND STRAIN (ICD-840.0) sling for comfort with ROm exercises throghoutte day.RTC 2 weeks fr f/u.  Complete Medication List: 1)  Asa 81mg   2)  Vit C Bid  3)  Allopurinol 300 Mg Tabs (Allopurinol) .Marland Kitchen.. 1 once daily 4)  Singulair 10 Mg Tabs (Montelukast sodium) .Marland Kitchen.. 1 by mouth once daily as needed 5)  Advair Diskus 500-50 Mcg/dose Aepb (Fluticasone-salmeterol) .... As needed 6)  Ventolin Hfa 108 (90 Base) Mcg/act Aers (Albuterol sulfate) .... As needed 7)  Colcrys 0.6 Mg Tabs (Colchicine) .Marland Kitchen.. 1 two times a day as needed   Orders Added: 1)  New Patient Level II [99202]     Complete  Medication List: 1)  Asa 81mg   2)  Vit C Bid  3)  Allopurinol 300 Mg Tabs (Allopurinol) .Marland Kitchen.. 1 once daily 4)  Singulair 10 Mg Tabs (Montelukast sodium) .Marland Kitchen.. 1 by mouth once daily as needed 5)  Advair Diskus 500-50 Mcg/dose Aepb (Fluticasone-salmeterol) .... As needed 6)  Ventolin Hfa 108 (90 Base) Mcg/act Aers (Albuterol sulfate) .... As needed 7)  Colcrys 0.6 Mg Tabs (Colchicine) .Marland Kitchen.. 1 two times a day as needed

## 2010-06-25 DIAGNOSIS — Z0000 Encounter for general adult medical examination without abnormal findings: Secondary | ICD-10-CM

## 2010-06-26 ENCOUNTER — Encounter (INDEPENDENT_AMBULATORY_CARE_PROVIDER_SITE_OTHER): Payer: BC Managed Care – PPO | Admitting: Internal Medicine

## 2010-06-26 ENCOUNTER — Encounter: Payer: Self-pay | Admitting: Internal Medicine

## 2010-06-26 ENCOUNTER — Other Ambulatory Visit: Payer: Self-pay | Admitting: Internal Medicine

## 2010-06-26 ENCOUNTER — Ambulatory Visit: Admit: 2010-06-26 | Payer: Self-pay | Admitting: Internal Medicine

## 2010-06-26 DIAGNOSIS — M109 Gout, unspecified: Secondary | ICD-10-CM

## 2010-06-26 DIAGNOSIS — E785 Hyperlipidemia, unspecified: Secondary | ICD-10-CM

## 2010-06-26 DIAGNOSIS — Z8249 Family history of ischemic heart disease and other diseases of the circulatory system: Secondary | ICD-10-CM

## 2010-06-26 DIAGNOSIS — Z0000 Encounter for general adult medical examination without abnormal findings: Secondary | ICD-10-CM

## 2010-06-26 DIAGNOSIS — J45909 Unspecified asthma, uncomplicated: Secondary | ICD-10-CM

## 2010-06-26 LAB — BASIC METABOLIC PANEL
BUN: 15 mg/dL (ref 6–23)
CO2: 25 mEq/L (ref 19–32)
Calcium: 9.1 mg/dL (ref 8.4–10.5)
Chloride: 103 mEq/L (ref 96–112)
Creatinine, Ser: 0.8 mg/dL (ref 0.4–1.5)
Glucose, Bld: 76 mg/dL (ref 70–99)

## 2010-06-26 LAB — CBC WITH DIFFERENTIAL/PLATELET
Basophils Absolute: 0.1 10*3/uL (ref 0.0–0.1)
Basophils Relative: 0.6 % (ref 0.0–3.0)
Eosinophils Absolute: 0.3 10*3/uL (ref 0.0–0.7)
MCHC: 34.6 g/dL (ref 30.0–36.0)
MCV: 92.6 fl (ref 78.0–100.0)
Monocytes Absolute: 1 10*3/uL (ref 0.1–1.0)
Neutrophils Relative %: 67.4 % (ref 43.0–77.0)
Platelets: 336 10*3/uL (ref 150.0–400.0)
RBC: 4.91 Mil/uL (ref 4.22–5.81)
RDW: 13.6 % (ref 11.5–14.6)

## 2010-06-26 LAB — URIC ACID: Uric Acid, Serum: 8.4 mg/dL — ABNORMAL HIGH (ref 4.0–7.8)

## 2010-06-26 LAB — HEPATIC FUNCTION PANEL
Albumin: 4.2 g/dL (ref 3.5–5.2)
Bilirubin, Direct: 0.1 mg/dL (ref 0.0–0.3)
Total Protein: 7.3 g/dL (ref 6.0–8.3)

## 2010-06-26 LAB — PSA: PSA: 0.66 ng/mL (ref 0.10–4.00)

## 2010-06-26 NOTE — Progress Notes (Signed)
Summary: Biaxin not avail  Phone Note From Pharmacy   Caller: [place] in [place]---[phone] Summary of Call: [place] called noting that Biaxin is not avail. They were advised to dispense the regular. Initial call taken by: Lucious Groves CMA,  May 16, 2010 4:59 PM

## 2010-06-26 NOTE — Progress Notes (Signed)
Summary: alt med request  Phone Note Call from Patient Call back at Home Phone [phone]   Summary of Call: Patient notes that the meds he was given have not been much help. His sinuses are somewhat better but his sore throat and cough are just as bad as previous. Pt would like alternate rx. Patient is leaving at noon to go out of town. Please advise.  Initial call taken by: Lucious Groves CMA,  May 16, 2010 10:48 AM  Follow-up for Phone Call        Per MD patient can try Biaxin XL 500 2qd #14.  Patient notes that the pharmacy # is [phone]. Patient aware. Lucious Groves CMA  May 16, 2010 3:44 PM      New/Updated Medications: BIAXIN XL 500 MG XR24H-TAB (CLARITHROMYCIN) 1 by mouth two times a day Prescriptions: BIAXIN XL 500 MG XR24H-TAB (CLARITHROMYCIN) 1 by mouth two times a day  #14 x 0   Entered by:   Lucious Groves CMA   Authorized by:   Marga Melnick MD   Signed by:   Lucious Groves CMA on 05/16/2010   Method used:   Print then Give to Patient   RxID:   [id number]

## 2010-06-26 NOTE — Assessment & Plan Note (Signed)
Summary: sinus/cough//kn   Vital Signs:  Patient profile:   55 year old male Weight:      182.6 pounds Temp:     98.0 degrees F oral BP sitting:   120 / 80  (left arm) Cuff size:   large  Vitals Entered By: Almeta Monas CMA Duncan Dull) (June 19, 2010 2:01 PM) CC: x4days c/o sinus pressure, drainage, and bilateral ear pressure, URI symptoms   History of Present Illness:       This is a 55 year old man who presents with URI symptoms.  The symptoms began 2 months ago.  Pt states he never got completely better.  The patient complains of nasal congestion, clear nasal discharge, purulent nasal discharge, productive cough, earache, and sick contacts, but denies sore throat and dry cough.  The patient denies fever, low-grade fever (<100.5 degrees), fever of 100.5-103 degrees, fever of 103.1-104 degrees, fever to >104 degrees, stiff neck, dyspnea, wheezing, rash, vomiting, diarrhea, use of an antipyretic, and response to antipyretic.  The patient also reports headache.  The patient denies itchy watery eyes, itchy throat, sneezing, seasonal symptoms, response to antihistamine, muscle aches, and severe fatigue.  The patient denies the following risk factors for Strep sinusitis: unilateral facial pain, unilateral nasal discharge, poor response to decongestant, double sickening, tooth pain, Strep exposure, tender adenopathy, and absence of cough.    Current Medications (verified): 1)  Asa 81mg  2)  Multivitamins  Tabs (Multiple Vitamin) .Marland Kitchen.. 1 By Mouth Once Daily 3)  Allopurinol 300 Mg  Tabs (Allopurinol) .Marland Kitchen.. 1 Once Daily 4)  Singulair 10 Mg Tabs (Montelukast Sodium) .Marland Kitchen.. 1 By Mouth Once Daily As Needed 5)  Advair Diskus 500-50 Mcg/dose Aepb (Fluticasone-Salmeterol) .... As Needed 6)  Ventolin Hfa 108 (90 Base) Mcg/act Aers (Albuterol Sulfate) .... As Needed 7)  Colcrys 0.6 Mg Tabs (Colchicine) .Marland Kitchen.. 1 Two Times A Day As Needed 8)  Tramadol Hcl 50 Mg Tabs (Tramadol Hcl) .... 40 9)  Dulera 200-5 Mcg/act  Aero (Mometasone Furo-Formoterol Fum) .Marland Kitchen.. 1-2 Puffs Every 12 Hrs ; Gargle & Spit After Use  Allergies (verified): No Known Drug Allergies  Physical Exam  General:  Well-developed,well-nourished,in no acute distress; alert,appropriate and cooperative throughout examination Ears:  External ear exam shows no significant lesions or deformities.  Otoscopic examination reveals clear canals, tympanic membranes are intact bilaterally without bulging, retraction, inflammation or discharge. Hearing is grossly normal bilaterally. Nose:  no external deformity, mucosal erythema, and mucosal edema.   Mouth:  pharyngeal erythema.   Lungs:  R decreased breath sounds and L decreased breath sounds.  Occassional wheeze Psych:  Cognition and judgment appear intact. Alert and cooperative with normal attention span and concentration. No apparent delusions, illusions, hallucinations   Impression & Recommendations:  Problem # 1:  BRONCHITIS- ACUTE (ICD-466.0)  The following medications were removed from the medication list:    Advair Diskus 500-50 Mcg/dose Aepb (Fluticasone-salmeterol) .Marland Kitchen... As needed    Cefuroxime Axetil 500 Mg Tabs (Cefuroxime axetil) .Marland Kitchen... 1 two times a day    Biaxin Xl 500 Mg Xr24h-tab (Clarithromycin) .Marland Kitchen... 1 by mouth two times a day His updated medication list for this problem includes:    Singulair 10 Mg Tabs (Montelukast sodium) .Marland Kitchen... 1 by mouth once daily as needed    Ventolin Hfa 108 (90 Base) Mcg/act Aers (Albuterol sulfate) .Marland Kitchen... As needed    Dulera 200-5 Mcg/act Aero (Mometasone furo-formoterol fum) .Marland Kitchen... 1-2 puffs every 12 hrs ; gargle & spit after use    Avelox 400 Mg  Tabs (Moxifloxacin hcl) .Marland Kitchen... 1 by mouth once daily  Take antibiotics and other medications as directed. Encouraged to push clear liquids, get enough rest, and take acetaminophen as needed. To be seen in 5-7 days if no improvement, sooner if worse.  Complete Medication List: 1)  Asa 81mg   2)  Multivitamins Tabs  (Multiple vitamin) .Marland Kitchen.. 1 by mouth once daily 3)  Allopurinol 300 Mg Tabs (Allopurinol) .Marland Kitchen.. 1 once daily 4)  Singulair 10 Mg Tabs (Montelukast sodium) .Marland Kitchen.. 1 by mouth once daily as needed 5)  Ventolin Hfa 108 (90 Base) Mcg/act Aers (Albuterol sulfate) .... As needed 6)  Colcrys 0.6 Mg Tabs (Colchicine) .Marland Kitchen.. 1 two times a day as needed 7)  Tramadol Hcl 50 Mg Tabs (Tramadol hcl) .... 40 8)  Dulera 200-5 Mcg/act Aero (Mometasone furo-formoterol fum) .Marland Kitchen.. 1-2 puffs every 12 hrs ; gargle & spit after use 9)  Avelox 400 Mg Tabs (Moxifloxacin hcl) .Marland Kitchen.. 1 by mouth once daily 10)  Flonase 50 Mcg/act Susp (Fluticasone propionate) .... 2 sprays each nostril once daily  Patient Instructions: 1)  Acute Bronchitis symptoms for less then 10 days are not  helped by antibiotics. Take over the counter cough medications. Call if no improvement in 5-7 days, sooner if increasing cough, fever, or new symptoms ( shortness of breath, chest pain) .  Prescriptions: FLONASE 50 MCG/ACT SUSP (FLUTICASONE PROPIONATE) 2 sprays each nostril once daily  #1 x 2   Entered and Authorized by:   Loreen Freud DO   Signed by:   Loreen Freud DO on 06/19/2010   Method used:   Electronically to        [facility] [phone][facility] (retail)       [address]       [place], [place]  [address]       Ph: [phone] or [phone]       Fax: [phone]   RxID:   [id number] AVELOX 400 MG TABS (MOXIFLOXACIN HCL) 1 by mouth once daily  #10 x 0   Entered and Authorized by:   Loreen Freud DO   Signed by:   Loreen Freud DO on 06/19/2010   Method used:   Electronically to        [facility] [phone]* (retail)       [address]       [place], [place]  [address]       Ph: [phone] or [phone]       Fax: [phone]   RxID:   [id number] SINGULAIR 10 MG TABS (MONTELUKAST SODIUM) 1 by mouth once daily as needed  #30 x 5   Entered and Authorized by:   Loreen Freud DO   Signed by:   Loreen Freud DO on 06/19/2010   Method used:    Electronically to        [facility] [phone][facility] (retail)       [address]       [place], [place]  [address]       Ph: [phone] or [phone]       Fax: [phone]   RxID:   [id number] DULERA 200-5 MCG/ACT AERO (MOMETASONE FURO-FORMOTEROL FUM) 1-2 puffs every 12 hrs ; gargle & spit after use  #1 x 5   Entered and Authorized by:   Loreen Freud DO   Signed by:   Loreen Freud DO on 06/19/2010   Method used:   [facility]        [facility] [address] [facility]  [address] (retail)       [address]       [place], [place]  [address]       Ph: [phone] or [phone]       Fax: [phone]   RxID:   [id number]    Orders Added: 1)  Est. Patient Level III [74259]

## 2010-06-26 NOTE — Miscellaneous (Signed)
[facility]: [facility]   Imported By: Marily Memos 05/30/2010 11:45:55  _____________________________________________________________________  External Attachment:    Type:   Image     Comment:   External Document

## 2010-06-26 NOTE — Assessment & Plan Note (Signed)
Summary: CONGESTION//PH   Vital Signs:  Patient profile:   55 year old male Weight:      179.6 pounds BMI:     25.50 Temp:     98.8 degrees F oral Pulse rate:   76 / minute Resp:     15 per minute BP sitting:   128 / 86  (left arm) Cuff size:   large  Vitals Entered By: Shonna Chock CMA (May 12, 2010 3:59 PM) CC: Congestion x 1 week , URI symptoms   Primary Care Provider:  Alwyn Ren  CC:  Congestion x 1 week  and URI symptoms.  History of Present Illness:      This is a 55 year old man who presents with RTI  symptoms; onset 12/12 as "wet cough".  The patient  now reports nasal congestion, scant purulent nasal discharge, slight sore throat, and rattly  cough w/o sputum, but denies earache.  Associated symptoms include slight  dyspnea and wheezing.  The patient denies fever but chills last night.  The patient denies headache or bilateral  facial pain   The patient denies the following risk factors for Strep sinusitis: tooth pain and tender adenopathy.  Rx: Mucinex , Neti pot, Advair,   Singulair  & Ventolin.  Current Medications (verified): 1)  Asa 81mg  2)  Multivitamins  Tabs (Multiple Vitamin) .Marland Kitchen.. 1 By Mouth Once Daily 3)  Allopurinol 300 Mg  Tabs (Allopurinol) .Marland Kitchen.. 1 Once Daily 4)  Singulair 10 Mg Tabs (Montelukast Sodium) .Marland Kitchen.. 1 By Mouth Once Daily As Needed 5)  Advair Diskus 500-50 Mcg/dose Aepb (Fluticasone-Salmeterol) .... As Needed 6)  Ventolin Hfa 108 (90 Base) Mcg/act Aers (Albuterol Sulfate) .... As Needed 7)  Colcrys 0.6 Mg Tabs (Colchicine) .Marland Kitchen.. 1 Two Times A Day As Needed 8)  Tramadol Hcl 50 Mg Tabs (Tramadol Hcl) .... 40  Allergies (verified): No Known Drug Allergies  Physical Exam  General:  in no acute distress; alert,appropriate and cooperative throughout examination Ears:  External ear exam shows no significant lesions or deformities.  Otoscopic examination reveals clear canals, tympanic membranes are intact bilaterally without bulging, retraction,  inflammation or discharge. Hearing is grossly normal bilaterally. Nose:  External nasal examination shows no deformity or inflammation. Nasal mucosa are pink and moist without lesions or exudates.Sniffling Mouth:  Oral mucosa and oropharynx without lesions or exudates.  Teeth in good repair. Lungs:  Normal respiratory effort, chest expands symmetrically. Lungs are clear to auscultation, no crackles or wheezes but  paroxysmal ,rattly cough with deep respirations . Cervical Nodes:  No lymphadenopathy noted Axillary Nodes:  No palpable lymphadenopathy   Impression & Recommendations:  Problem # 1:  SINUSITIS- ACUTE-NOS (ICD-461.9)  His updated medication list for this problem includes:    Cefuroxime Axetil 500 Mg Tabs (Cefuroxime axetil) .Marland Kitchen... 1 two times a day  Problem # 2:  BRONCHITIS-ACUTE (ICD-466.0) RAD component suggested His updated medication list for this problem includes:    Singulair 10 Mg Tabs (Montelukast sodium) .Marland Kitchen... 1 by mouth once daily as needed    Advair Diskus 500-50 Mcg/dose Aepb (Fluticasone-salmeterol) .Marland Kitchen... As needed    Ventolin Hfa 108 (90 Base) Mcg/act Aers (Albuterol sulfate) .Marland Kitchen... As needed    Dulera 200-5 Mcg/act Aero (Mometasone furo-formoterol fum) .Marland Kitchen... 1-2 puffs every 12 hrs ; gargle & spit after use    Cefuroxime Axetil 500 Mg Tabs (Cefuroxime axetil) .Marland Kitchen... 1 two times a day  Complete Medication List: 1)  Asa 81mg   2)  Multivitamins Tabs (Multiple vitamin) .Marland KitchenMarland KitchenMarland Kitchen 1  by mouth once daily 3)  Allopurinol 300 Mg Tabs (Allopurinol) .Marland Kitchen.. 1 once daily 4)  Singulair 10 Mg Tabs (Montelukast sodium) .Marland Kitchen.. 1 by mouth once daily as needed 5)  Advair Diskus 500-50 Mcg/dose Aepb (Fluticasone-salmeterol) .... As needed 6)  Ventolin Hfa 108 (90 Base) Mcg/act Aers (Albuterol sulfate) .... As needed 7)  Colcrys 0.6 Mg Tabs (Colchicine) .Marland Kitchen.. 1 two times a day as needed 8)  Tramadol Hcl 50 Mg Tabs (Tramadol hcl) .... 40 9)  Dulera 200-5 Mcg/act Aero (Mometasone furo-formoterol  fum) .Marland Kitchen.. 1-2 puffs every 12 hrs ; gargle & spit after use 10)  Cefuroxime Axetil 500 Mg Tabs (Cefuroxime axetil) .Marland Kitchen.. 1 two times a day  Patient Instructions: 1)  Hold Advair while on Dulera. 2)  Drink as much  NON dairy fluid as you can tolerate for the next few days. Prescriptions: CEFUROXIME AXETIL 500 MG TABS (CEFUROXIME AXETIL) 1 two times a day  #20 x 0   Entered and Authorized by:   Marga Melnick MD   Signed by:   Marga Melnick MD on 05/12/2010   Method used:   Print then Give to Patient   RxID:   [id number] DULERA 200-5 MCG/ACT AERO (MOMETASONE FURO-FORMOTEROL FUM) 1-2 puffs every 12 hrs ; gargle & spit after use  #1 x 5   Entered and Authorized by:   Marga Melnick MD   Signed by:   Marga Melnick MD on 05/12/2010   Method used:   Print then Give to Patient   RxID:   [id number]    Orders Added: 1)  Est. Patient Level III [84696]

## 2010-07-02 NOTE — Assessment & Plan Note (Signed)
Summary: cpx & labs/kn     confirmed with patient    2/1   ///sph   Vital Signs:  Patient profile:   55 year old male Height:      70.5 inches Weight:      179.2 pounds BMI:     25.44 Temp:     98.6 degrees F oral Pulse rate:   72 / minute Resp:     14 per minute BP sitting:   122 / 88  (left arm) Cuff size:   large  Vitals Entered By: Shonna Chock CMA (June 26, 2010 8:35 AM) CC: CPX with fasting labs , Cough   Primary Care Provider:  Alwyn Ren  CC:  CPX with fasting labs  and Cough.  History of Present Illness:    Dennis Watkins is here for a physical; his RTI symptoms are improving with present regimen.He  now describes  non-productive cough, but denies pleuritic chest pain, shortness of breath, wheezing, exertional dyspnea, fever, and malaise.  Associated symtpoms include nasal congestionwith PNDrainage. The patient denies the following symptoms: cold/URI symptoms and acid reflux symptoms.  The cough is worse  lying down.    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 [place] LDL goal = < 130. NMR Panel goal =< 80. Gout, PMH of  NASH, PMH of  Past Surgical History: Deviated nasal  septum repair; Endoscopy: HH, gastritis 2007; PUD @ Endoscopy  1994 ([place]); Colonoscopy negative ? 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         Seasonal variation in weight.  Physical Exam  General:  well-nourished; alert,appropriate and cooperative throughout examination Head:  Normocephalic and atraumatic without obvious abnormalities. No apparent alopecia ; goatee  Eyes:  No corneal or conjunctival inflammation noted. Perrla. Funduscopic exam benign, without hemorrhages, exudates or papilledema.  Ears:  External ear exam shows no significant lesions or deformities.  Otoscopic examination reveals clear canals, tympanic membranes are intact bilaterally without bulging, retraction, inflammation or discharge. Hearing is grossly normal bilaterally. Slight wax collection  on L Nose:  External nasal examination shows no deformity or inflammation. Nasal mucosa are pink and moist without lesions or exudates. Septal dislocation Mouth:  Oral mucosa and oropharynx without lesions or exudates.  Teeth in good repair. Neck:  No deformities, masses, or tenderness noted. Thyroid physiologic asymmetry w/o nodules Lungs:  Normal respiratory effort, chest expands symmetrically. Lungs are clear to auscultation, no crackles or wheezes. Heart:  Normal rate and regular rhythm. S1 and S2 normal without gallop, murmur, click, rub. S4 Abdomen:  Bowel sounds positive,abdomen soft and non-tender without masses, organomegaly or hernias noted. Rectal:  No external abnormalities noted. Normal sphincter tone. No rectal masses or tenderness. Genitalia:  Testes bilaterally descended without nodularity, tenderness or masses. No scrotal  masses or lesions. No penis lesions or urethral discharge. Prostate:  Prostate gland firm and smooth, no enlargement, nodularity, tenderness, mass, asymmetry or induration. Msk:  No deformity or scoliosis noted of thoracic  or lumbar spine.   Pulses:  R and L carotid,radial,dorsalis pedis and posterior tibial pulses are full and equal bilaterally Extremities:  No clubbing, cyanosis, edema, or deformity noted with normal full range of motion of all joints.   Neurologic:  alert & oriented X3, gait normal, and DTRs symmetrical and normal.   Skin:  Intact without suspicious lesions or rashes Cervical Nodes:  No lymphadenopathy noted Axillary Nodes:  No palpable lymphadenopathy Inguinal Nodes:  No significant adenopathy Psych:  memory intact for recent and remote, normally interactive, and good eye contact.     Impression & Recommendations:  Problem # 1:  ROUTINE GENERAL MEDICAL EXAM@HEALTH  CARE FACL (ICD-V70.0)  Orders: EKG w/ Interpretation (93000) Venipuncture ([address]) T- * Misc. Laboratory test [phone]) Specimen Handling (21308) TLB-BMP (Basic Metabolic Panel-BMET) (80048-METABOL) TLB-CBC Platelet - w/Differential (85025-CBCD) TLB-Hepatic/Liver Function Pnl (80076-HEPATIC) TLB-TSH (Thyroid Stimulating Hormone) (84443-TSH) TLB-Uric Acid, Blood (84550-URIC) TLB-PSA (Prostate Specific Antigen) (84153-PSA)  Problem # 2:  ASTHMA, UNSPECIFIED, UNSPECIFIED STATUS (ICD-493.90) Quiescent, EIB & post infection exacerbation only His updated medication list for this problem includes:    Singulair 10 Mg Tabs (Montelukast sodium) .Marland Kitchen... 1 by mouth once daily as needed    Ventolin Hfa 108 (90 Base) Mcg/act Aers (Albuterol sulfate) .Marland Kitchen... As needed    Dulera 200-5 Mcg/act Aero (Mometasone furo-formoterol fum) .Marland Kitchen... 1-2 puffs every 12 hrs ; gargle & spit after use  Problem # 3:  HYPERLIPIDEMIA (ICD-272.4)  Orders: T- * Misc. Laboratory test [phone])  Problem # 4:  ATHEROSCLEROTIC HEART DISEASE, FAMILY HX (ICD-V17.3)  Orders: T- * Misc. Laboratory test [phone])  Problem # 5:  GOUT (ICD-274.9) PMH of His updated medication list for this problem includes:    Allopurinol 300 Mg Tabs (Allopurinol) .Marland Kitchen... 1 once daily     Colcrys 0.6 Mg Tabs (Colchicine) .Marland Kitchen... 1 two times a day as needed  Complete Medication List: 1)  Asa 81mg   2)  Multivitamins Tabs (Multiple vitamin) .Marland Kitchen.. 1 by mouth once daily 3)  Allopurinol 300 Mg Tabs (Allopurinol) .Marland Kitchen.. 1 once daily 4)  Singulair 10 Mg Tabs (Montelukast sodium) .Marland Kitchen.. 1 by mouth once daily as needed 5)  Ventolin Hfa 108 (90 Base) Mcg/act Aers (Albuterol sulfate) .... As needed 6)  Colcrys 0.6 Mg Tabs (Colchicine) .Marland Kitchen.. 1 two times a day as needed 7)  Dulera 200-5 Mcg/act Aero (Mometasone furo-formoterol fum) .Marland Kitchen.. 1-2 puffs every 12 hrs ; gargle & spit after use 8)  Avelox 400 Mg Tabs (Moxifloxacin hcl) .Marland Kitchen.. 1 by mouth once daily 9)  Flonase 50 Mcg/act Susp (Fluticasone propionate) .... 2 sprays each nostril once daily  Patient Instructions: 1)  Neti pot as needed for secretions. Albuterol MDI 1-2 puffs 15 min pre exercise as needed .   Orders Added: 1)  Est. Patient 40-64 years [99396] 2)  EKG w/ Interpretation [93000] 3)  Venipuncture [36415] 4)  T- * Misc. Laboratory test [99999] 5)  Specimen Handling [99000] 6)  TLB-BMP (Basic Metabolic Panel-BMET) [80048-METABOL] 7)  TLB-CBC Platelet - w/Differential [85025-CBCD] 8)  TLB-Hepatic/Liver Function Pnl [80076-HEPATIC] 9)  TLB-TSH (Thyroid Stimulating Hormone) [84443-TSH] 10)  TLB-Uric Acid, Blood [84550-URIC] 11)  TLB-PSA (Prostate Specific Antigen) [52841-LKG]

## 2010-07-11 ENCOUNTER — Telehealth: Payer: Self-pay | Admitting: Internal Medicine

## 2010-07-16 NOTE — Progress Notes (Signed)
Summary: Flonase issue  Phone Note Call from Patient Call back at [facility] Phone [phone]   Summary of Call: Patient called noting that he has been using Flonase daily and his symptoms are much better. He notes that the past two mornings he has had some nasal bleeding immediatley after using the spray. Patient would like to know if he should continue the spray daily, change to every other day, or stop it entirely.  Please advise. Initial call taken by: Lucious Groves CMA,  July 11, 2010 8:53 AM  Follow-up for Phone Call        use as needed only now that he is stable Follow-up by: Marga Melnick MD,  July 11, 2010 2:56 PM  Additional Follow-up for Phone Call Additional follow up Details #1::        Patient notified. Additional Follow-up by: Lucious Groves CMA,  July 11, 2010 3:49 PM

## 2010-08-04 ENCOUNTER — Other Ambulatory Visit: Payer: Self-pay | Admitting: Internal Medicine

## 2010-08-04 ENCOUNTER — Ambulatory Visit (INDEPENDENT_AMBULATORY_CARE_PROVIDER_SITE_OTHER): Payer: BC Managed Care – PPO | Admitting: Internal Medicine

## 2010-08-04 ENCOUNTER — Ambulatory Visit (INDEPENDENT_AMBULATORY_CARE_PROVIDER_SITE_OTHER)
Admission: RE | Admit: 2010-08-04 | Discharge: 2010-08-04 | Disposition: A | Discharge status: Home / Self Care | Payer: BC Managed Care – PPO | Source: Ambulatory Visit | Attending: Internal Medicine | Admitting: Internal Medicine

## 2010-08-04 ENCOUNTER — Encounter: Payer: Self-pay | Admitting: Internal Medicine

## 2010-08-04 DIAGNOSIS — J069 Acute upper respiratory infection, unspecified: Secondary | ICD-10-CM

## 2010-08-04 DIAGNOSIS — J209 Acute bronchitis, unspecified: Secondary | ICD-10-CM

## 2010-08-04 DIAGNOSIS — J309 Allergic rhinitis, unspecified: Secondary | ICD-10-CM | POA: Insufficient documentation

## 2010-08-04 LAB — CBC WITH DIFFERENTIAL/PLATELET
Basophils Absolute: 0 10*3/uL (ref 0.0–0.1)
Hemoglobin: 15 g/dL (ref 13.0–17.0)
Lymphocytes Relative: 25.4 % (ref 12.0–46.0)
Monocytes Relative: 11.1 % (ref 3.0–12.0)
Platelets: 275 10*3/uL (ref 150.0–400.0)
RDW: 14 % (ref 11.5–14.6)
WBC: 6.4 10*3/uL (ref 4.5–10.5)

## 2010-08-05 ENCOUNTER — Telehealth (INDEPENDENT_AMBULATORY_CARE_PROVIDER_SITE_OTHER): Payer: Self-pay | Admitting: *Deleted

## 2010-08-06 ENCOUNTER — Telehealth (INDEPENDENT_AMBULATORY_CARE_PROVIDER_SITE_OTHER): Payer: Self-pay | Admitting: *Deleted

## 2010-08-12 NOTE — Progress Notes (Signed)
Summary: no better  Phone Note Call from Patient Call back at Home Phone [phone] [facility]     Summary of Call: Pt called says that he spoke w/ Hop yesterday and was told to call back if he wasn't improved. Says he now fells like something needs to be called in.    [facility] Initial call taken by: Doristine Devoid CMA,  August 06, 2010 4:32 PM  Follow-up for Phone Call        per Hop will call prednisone patient aware of information .Marland KitchenMarland KitchenMarland KitchenDoristine Devoid CMA  August 06, 2010 5:30 PM     New/Updated Medications: PREDNISONE 20 MG TABS (PREDNISONE) take one tablet two times a day w/ meals Prescriptions: PREDNISONE 20 MG TABS (PREDNISONE) take one tablet two times a day w/ meals  #10 x 0   Entered by:   Doristine Devoid CMA   Authorized by:   Marga Melnick MD   Signed by:   Doristine Devoid CMA on 08/06/2010   Method used:   [facility]        [facility] [phone]* (retail)       [address]       [place], [place]  [address]       Ph: [phone] or [phone]       Fax: [phone]   RxID:   [id number]

## 2010-08-12 NOTE — Assessment & Plan Note (Signed)
Summary: cough,drainage/nta   Vital Signs:  Patient profile:   55 year old male Weight:      181 pounds BMI:     25.70 Temp:     98.3 degrees F oral Pulse rate:   84 / minute Resp:     14 per minute BP sitting:   112 / 66  (left arm) Cuff size:   large  Vitals Entered By: Shonna Chock CMA (August 04, 2010 11:52 AM) CC: Cough and drainage, URI symptoms   Primary Care Provider:  Alwyn Ren  CC:  Cough and drainage and URI symptoms.  History of Present Illness:   Onset as 7 days ago as ST & dry cough; he now  reports purulent (occasioanl green) nasal discharge, sore throat, and productive cough ( thick , gray / green).  Associated symptoms include low-grade fever (<100.5 degrees) and  intermittent dyspnea. Ventolin MDI used  1-2 X /day. The patient denies wheezing.  The patient also reports sneezing.  The patient denies itchy watery eyes and frontal  headache.  The patient denies the following risk factors for Strep sinusitis: unilateral facial pain, tooth pain, and tender adenopathy.    Current Medications (verified): 1)  Asa 81mg  2)  Multivitamins  Tabs (Multiple Vitamin) .Marland Kitchen.. 1 By Mouth Once Daily 3)  Allopurinol 300 Mg  Tabs (Allopurinol) .Marland Kitchen.. 1 Once Daily 4)  Singulair 10 Mg Tabs (Montelukast Sodium) .Marland Kitchen.. 1 By Mouth Once Daily As Needed 5)  Ventolin Hfa 108 (90 Base) Mcg/act Aers (Albuterol Sulfate) .... As Needed 6)  Colcrys 0.6 Mg Tabs (Colchicine) .Marland Kitchen.. 1 Two Times A Day As Needed 7)  Dulera 200-5 Mcg/act Aero (Mometasone Furo-Formoterol Fum) .Marland Kitchen.. 1-2 Puffs Every 12 Hrs ; Gargle & Spit After Use 8)  Flonase 50 Mcg/act Susp (Fluticasone Propionate) .... 2 Sprays Each Nostril Once Daily 9)  Claritin 10 Mg Tabs (Loratadine) .Marland Kitchen.. 1 By Mouth Once Daily  Allergies (verified): No Known Drug Allergies  Physical Exam  General:  well-nourished,in no acute distress; alert,appropriate and cooperative throughout examination Ears:  External ear exam shows no significant lesions or  deformities.  Otoscopic examination reveals clear canals, tympanic membranes are intact bilaterally without bulging, retraction, inflammation or discharge. Hearing is grossly normal bilaterally. Nose:  External nasal examination shows no deformity or inflammation. Nasal mucosa are pink and moist without lesions or exudates. septal dislocation Mouth:  Oral mucosa and oropharynx without lesions or exudates.  Teeth in good repair. Lungs:  Normal respiratory effort, chest expands symmetrically. Lungs ; mild scattered rhonchi &  wheezes. Brassy cough Cervical Nodes:  No lymphadenopathy noted Axillary Nodes:  No palpable lymphadenopathy   Impression & Recommendations:  Problem # 1:  BRONCHITIS-ACUTE (ICD-466.0)  RAD , suboptimally controlledasthma clinically His updated medication list for this problem includes:    Singulair 10 Mg Tabs (Montelukast sodium) .Marland Kitchen... 1 by mouth once daily as needed    Ventolin Hfa 108 (90 Base) Mcg/act Aers (Albuterol sulfate) .Marland Kitchen... As needed    Dulera 200-5 Mcg/act Aero (Mometasone furo-formoterol fum) .Marland Kitchen... 1-2 puffs every 12 hrs ; gargle & spit after use  Orders: Venipuncture (04540) TLB-CBC Platelet - w/Differential (85025-CBCD) T-2 View CXR (71020TC) T-Sinuses Complete (70220TC) Specimen Handling (98119)  Problem # 2:  URI (ICD-465.9)  vs extrinsic process; care should be co-ordinated with Dr Jacolyn Reedy, [facility] His updated medication list for this problem includes:    Claritin 10 Mg Tabs (Loratadine) .Marland Kitchen... 1 by mouth once daily  Orders: T-Sinuses Complete (70220TC) Specimen Handling (14782)  Problem # 3:  RHINITIS (ICD-477.9)  His updated medication list for this problem includes:    Flonase 50 Mcg/act Susp (Fluticasone propionate) .Marland Kitchen... 2 sprays each nostril once daily    Claritin 10 Mg Tabs (Loratadine) .Marland Kitchen... 1 by mouth once daily    Astepro 0.15 % Soln (Azelastine hcl) .Marland Kitchen... 2 sprays once daily  Complete Medication List: 1)  Asa 81mg   2)   Multivitamins Tabs (Multiple vitamin) .Marland Kitchen.. 1 by mouth once daily 3)  Allopurinol 300 Mg Tabs (Allopurinol) .Marland Kitchen.. 1 once daily 4)  Singulair 10 Mg Tabs (Montelukast sodium) .Marland Kitchen.. 1 by mouth once daily as needed 5)  Ventolin Hfa 108 (90 Base) Mcg/act Aers (Albuterol sulfate) .... As needed 6)  Colcrys 0.6 Mg Tabs (Colchicine) .Marland Kitchen.. 1 two times a day as needed 7)  Dulera 200-5 Mcg/act Aero (Mometasone furo-formoterol fum) .Marland Kitchen.. 1-2 puffs every 12 hrs ; gargle & spit after use 8)  Flonase 50 Mcg/act Susp (Fluticasone propionate) .... 2 sprays each nostril once daily 9)  Claritin 10 Mg Tabs (Loratadine) .Marland Kitchen.. 1 by mouth once daily 10)  Astepro 0.15 % Soln (Azelastine hcl) .... 2 sprays once daily  Patient Instructions: 1)  Trial of Allegra in place of Claritin. Prescriptions: ASTEPRO 0.15 % SOLN (AZELASTINE HCL) 2 sprays once daily  #1 x 5   Entered and Authorized by:   Marga Melnick MD   Signed by:   Marga Melnick MD on 08/04/2010   Method used:   Print then Give to Patient   RxID:   [id number]    Orders Added: 1)  Est. Patient Level III [95621] 2)  Venipuncture [30865] 3)  TLB-CBC Platelet - w/Differential [85025-CBCD] 4)  T-2 View CXR [71020TC] 5)  T-Sinuses Complete [70220TC] 6)  Specimen Handling [99000]

## 2010-08-12 NOTE — Progress Notes (Signed)
Summary: Lab Results  Phone Note Outgoing Call Call back at Home Phone [phone] [facility]     Call placed by: Shonna Chock CMA,  August 05, 2010 4:50 PM Call placed to: Patient Summary of Call: Dr.Hopper spoke with patient:  Complete blood count is normal; allergic cell count is not elevated but extrinsic or allergic component to airway reactivity can not be ruled out. Co-ordination of care with Dr Ladona Ridgel is important. Sinus & Chest Xrays are normal. If reactive airways are not controlled with the present regimen, I recommend a short course of Prednisone. If symptoms persist Pulmonary Function Tests are indicated. The controller agent Elwin Sleight) is most important intervention. Hopp  **Copies mailed**

## 2010-09-04 ENCOUNTER — Ambulatory Visit (INDEPENDENT_AMBULATORY_CARE_PROVIDER_SITE_OTHER): Payer: BC Managed Care – PPO | Admitting: Internal Medicine

## 2010-09-04 ENCOUNTER — Encounter: Payer: Self-pay | Admitting: Internal Medicine

## 2010-09-04 VITALS — BP 110/78 | HR 72 | Temp 98.2°F | Wt 180.2 lb

## 2010-09-04 DIAGNOSIS — R5381 Other malaise: Secondary | ICD-10-CM

## 2010-09-04 DIAGNOSIS — R509 Fever, unspecified: Secondary | ICD-10-CM

## 2010-09-04 DIAGNOSIS — L719 Rosacea, unspecified: Secondary | ICD-10-CM

## 2010-09-04 DIAGNOSIS — J45991 Cough variant asthma: Secondary | ICD-10-CM

## 2010-09-04 DIAGNOSIS — R5383 Other fatigue: Secondary | ICD-10-CM

## 2010-09-04 DIAGNOSIS — R197 Diarrhea, unspecified: Secondary | ICD-10-CM

## 2010-09-04 MED ORDER — DOXYCYCLINE HYCLATE 100 MG PO TABS: 100.0000 mg | ORAL_TABLET | Freq: Two times a day (BID) | ORAL | Status: AC

## 2010-09-04 NOTE — Patient Instructions (Signed)
Please substitute the  Symbicort  160/4.5 for the lateral; 2 puffs every 12 hours using spacer. Gargle and spit after use.

## 2010-09-04 NOTE — Progress Notes (Signed)
Subjective:    Patient ID: Dennis Watkins, male    DOB: 04-06-1956, 55 y.o.   MRN: [id number]  URI  Chronicity: He states that he was 85% improved following the [facility]  and the prednisone. Episode onset: The cough recurred 08/31/10 and has been associated with lethargy. The problem has been unchanged. The maximum temperature recorded prior to his arrival was 100 - 100.9 F (He had chills and low-grade fever 4/10). The fever has been present for less than 1 day. Associated symptoms include coughing, diarrhea, joint pain and a rash. Pertinent negatives include no abdominal pain, chest pain, dysuria, ear pain, headaches, joint swelling, nausea, sinus pain, sneezing, sore throat, swollen glands, vomiting or wheezing. Associated symptoms comments: He's had a nonproductive cough. Also he's had diffuse arthralgias. He had frank diarrhea on Monday 4/9. He has tried inhaler use (He has had a significant improvement using albuterol metered-dose inhaler. He  has continued the [facility]) for the symptoms. The treatment provided significant relief.       Review of Systems  HENT: Negative for ear pain, sore throat and sneezing.   Respiratory: Positive for cough. Negative for wheezing.   Cardiovascular: Negative for chest pain.  Gastrointestinal: Positive for diarrhea. Negative for nausea, vomiting and abdominal pain.  Genitourinary: Negative for dysuria.  Musculoskeletal: Positive for joint pain.  Skin: Positive for rash.  Neurological: Negative for headaches.   The rash is localized to the maxillary areas; he has a past history of rosacea. It was verified that he is not having any purulent secretions from his head or chest. He denies any significant extrinsic symptoms of itchy eyes and sneezing . FATIGUE;  Onset: 4/8 ( but it is recurring)  Fatigue even w/o  with exertion Physical limitations: cardio limitations Primarily motivational fatigue: absolutely not   Symptoms Night sweats: yes, 4/10  Weight loss: no    Exertional chest pain: no  Dyspnea: yes   Hemoptysis: no  New medications: no  Leg swelling: no   PND: no  Melena: no  Adenopathy: no  Severe snoring: no  Daytime sleepiness: no  Skin/hair / nail  changes: no, only facial rash  Feeling depressed: no  Anhedonia: no  Altered appetite: no  Poor sleep: no       Objective:   Physical Exam he is in no acute distress; he appears tired.  His skin is warm and dry. He has a mild localized erythematous rash over the right axillary area. No jaundice is present.  The sclera reveals no icterus.  The nasal mucosa is dry without exudates. He does have slight septal dislocation deviation.  But tympanic membranes are normal the otic canals are clear.  The oral pharynx reveals no exudates or significant erythema.  He has no axillary or neck lymphadenopathy  His chest is clear without rales, rhonchi, or wheezes.  He exhibits an S4; there is no significant murmur or rhythm change.  Abdomen is nontender without organomegaly or masses.          Assessment & Plan:  #1 he has recurrent cough; historically this would suggest reactive airways disease/asthma. This is supported by his improvement with the [facility] & prednisone.  #2 he's had isolated fever , chills  &  diarrhea associated with arthralgias.  #3 he describes fatigue in the context of the items above.  His labs were reviewed back to August/2009. Intermittently he does have elevated uric acid levels as high as 8.4. In January of 2011 he had mild elevation of  his liver function test. This subsequently resolved.  His CBC and differentials have not revealed any significant abnormalities and his thyroid levels have been in the  Ideal range.  By history an acute bacterial infection or an allergic etiology not suggestive.  I will recommend extensive pulmonary function test. The Mikey Bussing will be changed to Symbicort.  He'll  be asked to continue his Singulair.  Because of the  recent diarrhea and the facial rash suggesting rosacea, doxycycline will be prescribed.

## 2010-09-05 ENCOUNTER — Ambulatory Visit: Payer: BC Managed Care – PPO | Admitting: Internal Medicine

## 2010-09-11 ENCOUNTER — Other Ambulatory Visit: Payer: Self-pay | Admitting: Internal Medicine

## 2010-09-11 NOTE — Telephone Encounter (Signed)
Dr.Hopper please advise, patient states he takes this for Rosacea

## 2010-09-11 NOTE — Telephone Encounter (Signed)
Please assess present symptoms; if there are signs to suggest infection, this can be refilled. He was just recently seen and evaluated.

## 2010-09-15 ENCOUNTER — Encounter: Payer: Self-pay | Admitting: *Deleted

## 2010-09-26 ENCOUNTER — Other Ambulatory Visit: Payer: Self-pay | Admitting: *Deleted

## 2010-09-26 MED ORDER — BUDESONIDE-FORMOTEROL FUMARATE 160-4.5 MCG/ACT IN AERO: 2.0000 | INHALATION_SPRAY | Freq: Two times a day (BID) | RESPIRATORY_TRACT | Status: DC

## 2010-10-07 ENCOUNTER — Ambulatory Visit (INDEPENDENT_AMBULATORY_CARE_PROVIDER_SITE_OTHER): Payer: BC Managed Care – PPO | Admitting: Internal Medicine

## 2010-10-07 ENCOUNTER — Telehealth: Payer: Self-pay | Admitting: Internal Medicine

## 2010-10-07 DIAGNOSIS — J45909 Unspecified asthma, uncomplicated: Secondary | ICD-10-CM

## 2010-10-07 LAB — PULMONARY FUNCTION TEST

## 2010-10-07 MED ORDER — ALLOPURINOL 300 MG PO TABS: 300.0000 mg | ORAL_TABLET | Freq: Every day | ORAL | Status: DC

## 2010-10-07 NOTE — Telephone Encounter (Signed)
Patient called pharmacy last Friday for refills---they say we have not responded and patient is leaving town on Thursday and will be gone for one month---he needs Allopurinol (90 day) and Doxycycline (whatever he got last time)---[facility] [place]

## 2010-10-07 NOTE — Progress Notes (Signed)
PFT done today. 

## 2010-10-07 NOTE — Telephone Encounter (Signed)
Dr.Hopper please advise, Doxy is not on his med list. Allopurinol was sent to the pharmacy with #90/0 refills and an indication that patient needs labs prior to next Allopurinol refill (Uric Acid Level-274.9)

## 2010-10-08 MED ORDER — DOXYCYCLINE HYCLATE 100 MG PO CAPS: 100.0000 mg | ORAL_CAPSULE | ORAL | Status: DC

## 2010-10-08 NOTE — Telephone Encounter (Signed)
See Staff Note; taking Doxycycline during Summer months has very high sun induced photosensitivity risk

## 2010-10-08 NOTE — Telephone Encounter (Signed)
Patient aware rx sen to pharmacy

## 2010-10-08 NOTE — Telephone Encounter (Signed)
Allopurinol #90 & Doxycycline 100 mg #10

## 2010-10-08 NOTE — Telephone Encounter (Signed)
1 qd for RTI; avoid direct sun while on this

## 2010-10-08 NOTE — Telephone Encounter (Signed)
Dr.Hopper please advise on instruction for Doxy

## 2010-10-10 ENCOUNTER — Other Ambulatory Visit: Payer: Self-pay

## 2010-10-10 MED ORDER — COLCHICINE 0.6 MG PO TABS: 0.6000 mg | ORAL_TABLET | Freq: Two times a day (BID) | ORAL | Status: DC | PRN

## 2010-10-10 NOTE — Assessment & Plan Note (Signed)
[facility]                           GASTROENTEROLOGY OFFICE NOTE   NAME:Dennis Watkins, Dennis Watkins                       MRN:          [id number]  DATE:12/23/2005                            DOB:          12-26-1955    Return office visit for GERD with erosive esophagitis and celiac disease.  His reflux symptoms are well controlled on omeprazole 20 mg daily.  He has  no gastrointestinal complaints.  He does have multiple questions about GERD,  erosive esophagitis, hiatal hernias, and long-term management of his celiac  disease and we have an extensive discussion about attempting to answer all  of his questions.  Current medications listed on the chart, updated, and  reviewed.   MEDICATION ALLERGIES:  None known.   PHYSICAL EXAMINATION:  GENERAL:  No acute distress.  VITAL SIGNS:  Weight 179.6, blood pressure is 128/78, pulse is 80 and  regular.   He is not re-examined.   ASSESSMENT AND PLAN:  1.  Gastroesophageal reflux disease complicated by erosive esophagitis.      Long-term antireflux measures and a proton-pump inhibitor on a daily      basis for long-term management.  The risk of Barrett's esophagus,      strictures, and other complications of uncontrolled gastroesophageal      reflux disease were thoroughly discussed with the patient.  Written      literature was supplied to him.  I answered all of his questions.  2.  Celiac disease.  He does admit that he does not completely follow a      gluten-free diet and his biopsies indicate persistent celiac disease at      a moderate level.  He is currently asymptomatic.  We had a long      discussion about management and I have encouraged him to continue follow-      up with his dietitian.  I have suggested two books on celiac disease to      read as well.  I did discuss the long-term risks of small bowel      lymphomas in celiac disease patients without optimal control.  We will      plan for an  endoscopy with repeat biopsies in about one year when he has      been more compliant with a gluten-free diet.  I attempted to answer all      of his questions.                                   Venita Lick. Pleas Koch., MD, Clementeen Graham   MTS/MedQ  DD:  12/24/2005  DT:  12/24/2005  Job #:  [id number]   cc:   Titus Dubin. Alwyn Ren, MD, FCCP

## 2010-12-17 ENCOUNTER — Encounter: Payer: Self-pay | Admitting: Internal Medicine

## 2011-01-19 ENCOUNTER — Encounter: Payer: Self-pay | Admitting: Family Medicine

## 2011-01-19 ENCOUNTER — Ambulatory Visit (INDEPENDENT_AMBULATORY_CARE_PROVIDER_SITE_OTHER): Payer: BC Managed Care – PPO | Admitting: Family Medicine

## 2011-01-19 VITALS — BP 130/90 | HR 82 | Temp 98.3°F | Ht 71.0 in | Wt 175.0 lb

## 2011-01-19 DIAGNOSIS — M79601 Pain in right arm: Secondary | ICD-10-CM

## 2011-01-19 DIAGNOSIS — M79609 Pain in unspecified limb: Secondary | ICD-10-CM

## 2011-01-19 NOTE — Patient Instructions (Signed)
Your exam is consistent with a dynamic issue of your right elbow from overuse. This should improve with time and the following: Ice elbow 15 minutes at a time after playing softball (up to 3-4 times a day as needed). Aleve 2 tabs twice a day with food for pain and inflammation - many people will take this for 1 week then as needed. Start physical therapy for 1 visit to learn general upper extremity strengthening program then do exercises daily for next 6 weeks at least. I would recommend you play either 1st or 2nd base for next 2-3 weeks (less risk of long distance, hard throwing at these positions). There is a physical therapist that works with [employer] who works with a lot of pitchers and baseball players - having him evaluate your form is another consideration if you're not improving.

## 2011-01-20 ENCOUNTER — Encounter: Payer: Self-pay | Admitting: Family Medicine

## 2011-01-20 DIAGNOSIS — M79601 Pain in right arm: Secondary | ICD-10-CM | POA: Insufficient documentation

## 2011-01-20 NOTE — Progress Notes (Signed)
Subjective:    Patient ID: Dennis Watkins, male    DOB: [birth year], 55 y.o.   MRN: [id number]  PCP: Dr. Alwyn Ren  HPI 55 yo M here with right arm pain.  Patient reports pain started about 2-3 weeks ago after playing softball for the first time this year. Doesn't recall an acute injury. States he was pitching but also plays several positions in the field. On throwing the ball very hard in subsequent games has felt pain lateral right elbow and upper arm. No numbness or tingling. No bowel or bladder dysfunction. No swelling or bruising. Similar thing happens each year when he plays softball, worse if he hasn't practiced prior to his first game. He is right handed. Has tried some ibuprofen. Pain worse with trying to erase board (persistent overhead motions).  Past Medical History  Diagnosis Date  . PUD (peptic ulcer disease) 1994  . 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 Asthma     as child;essentially resolved in teens  . Hyperlipidemia   . Gout     [facility]  . NASH (nonalcoholic steatohepatitis)     [facility]    Current Outpatient Prescriptions on File Prior to Visit  Medication Sig Dispense Refill  . albuterol (VENTOLIN HFA) 108 (90 BASE) MCG/ACT inhaler Inhale 2 puffs into the lungs as needed.        Marland Kitchen allopurinol (ZYLOPRIM) 300 MG tablet Take 1 tablet (300 mg total) by mouth daily.  90 tablet  0  . aspirin 81 MG tablet Take 81 mg by mouth daily.        Marland Kitchen azelastine (ASTEPRO) 137 MCG/SPRAY nasal spray 1 spray by Nasal route 2 (two) times daily. Use in each nostril as directed       . budesonide-formoterol (SYMBICORT) 160-4.5 MCG/ACT inhaler Inhale 2 puffs into the lungs 2 (two) times daily.  1 Inhaler  3  . colchicine 0.6 MG tablet Take 0.6 mg by mouth 2 (two) times daily as needed.        . colchicine 0.6 MG tablet Take 1 tablet (0.6 mg total) by mouth 2 (two) times daily as needed.  30 tablet  1  . fluticasone (FLONASE) 50 MCG/ACT nasal spray Place 2 sprays into the nose daily.  Each nostril       . Loratadine (CLARITIN) 10 MG CAPS Take 10 mg by mouth daily.        . Mometasone Furo-Formoterol Fum (DULERA) 200-5 MCG/ACT AERO Inhale 200 mcg into the lungs. 1-2 puffs every 12 hrs;gargle & spit after use       . montelukast (SINGULAIR) 10 MG tablet Take 10 mg by mouth daily as needed.        . Multiple Vitamin (MULTIVITAMIN) tablet Take 1 tablet by mouth daily.          Past Surgical History  Procedure Date  . Tonsillectomy   . Nasal septum surgery   . Upper gastrointestinal endoscopy     No Known Allergies  History   Social History  . Marital Status: Divorced    Spouse Name: N/A    Number of Children: N/A  . Years of Education: N/A   Occupational History  . 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 Smoking status: Never Smoker   . Smokeless tobacco: Not on file  . Alcohol Use: Yes     minimally  . Drug Use: No  . Sexually Active: Not on file  Other Topics Concern  . Not on file   Social History Narrative   SingleRegular exercise;biking walking 2-3 xweekly    Family History  Problem Relation Age of Onset  . Cancer Mother     breast  . Depression Father   . Heart disease Father     cabg,MI @ 107  . Hypertension Father   . Gout Father     BP 130/90  Pulse 82  Temp(Src) 98.3 F (36.8 C) (Oral)  Ht 5\' 11"  (1.803 m)  Wt 175 lb (79.379 kg)  BMI 24.41 kg/m2  Review of Systems See HPI above.    Objective:   Physical Exam Gen: NAD  Neck: No gross deformity, swelling, bruising. No paraspinal TTP .  No midline/bony TTP. FROM neck without pain . BUE strength 5/5.  Sensation intact to light touch.  2+ equal reflexes in biceps, 1+ equal triceps and brachioradialis tendons. Negative spurlings. NV intact distal BUEs.  R shoulder: No swelling, ecchymoses.  No gross deformity. No TTP. FROM. Negative Hawkins, Neers. Negative Speeds, Yergasons. Negative Empty can and resisted internal/external rotation with 5/5  strength. Negative apprehension. NV intact distally.  R elbow: No swelling, ecchymoses.  No gross deformity. No TTP. FROM. Collateral ligaments intact. Negative tinels radial and cubital tunnels.    Assessment & Plan:  1. Right arm pain - exam completely benign today and history without any red flags.  No swelling, decreased motion, bony tenderness.  Shoulder and neck exams normal as well.  Believe this is more of a dynamic issue, overuse causing muscular soreness as well as elbow joint strain.  Discussed icing, nsaids (to be careful with h/o PUD), relative rest (discussed playing 1st or 2nd base as less risk of hard throwing in these positions), start PT for 1 visit to learn elbow strengthening program.  Reassured.  Do not think radiographs or further imaging warranted at this time based on his exam.

## 2011-01-20 NOTE — Assessment & Plan Note (Signed)
exam completely benign today and history without any red flags.  No swelling, decreased motion, bony tenderness.  Shoulder and neck exams normal as well.  Believe this is more of a dynamic issue, overuse causing muscular soreness as well as elbow joint strain.  Discussed icing, nsaids (to be careful with h/o PUD), relative rest (discussed playing 1st or 2nd base as less risk of hard throwing in these positions), start PT for 1 visit to learn elbow strengthening program.  Reassured.  Do not think radiographs or further imaging warranted at this time based on his exam.

## 2011-02-23 ENCOUNTER — Encounter: Payer: Self-pay | Admitting: Internal Medicine

## 2011-02-23 ENCOUNTER — Ambulatory Visit (INDEPENDENT_AMBULATORY_CARE_PROVIDER_SITE_OTHER): Payer: BC Managed Care – PPO | Admitting: Internal Medicine

## 2011-02-23 DIAGNOSIS — L719 Rosacea, unspecified: Secondary | ICD-10-CM

## 2011-02-23 DIAGNOSIS — B009 Herpesviral infection, unspecified: Secondary | ICD-10-CM

## 2011-02-23 MED ORDER — METRONIDAZOLE 1 % EX GEL: Freq: Every day | CUTANEOUS | Status: DC

## 2011-02-23 MED ORDER — DOXYCYCLINE HYCLATE 100 MG PO TABS: 100.0000 mg | ORAL_TABLET | Freq: Two times a day (BID) | ORAL | Status: AC

## 2011-02-23 NOTE — Patient Instructions (Signed)
Optimally it is best to culture any frank pustules. The doxycycline can be used for significant rosacea flares. The MetroGel at bedtime after cleansing should be prevented.

## 2011-02-23 NOTE — Progress Notes (Signed)
  Subjective:    Patient ID: Dennis Watkins, male    DOB: Oct 08, 1955, 55 y.o.   MRN: [id number]  HPI Last week he noticed a blister type lesion above the  left lip. He used Abreva with improvement in the character of the lesion. He states it did have some purulence. He likened it to prior lesions is experienced with rosacea. He also noted smaller papular type lesions of the left medial cheek which also seemed to respond to the Abreva.  He has not had rosacea for several years until the last several months. Rosacea has responded to doxycycline in the past   Review of Systems     Objective:   Physical Exam  He appears healthy in no acute distress at this time  Nares are patent with no exudates. There is slight septal dislocation to the left.   oral hygiene is excellent. There is no erythema or exudates of  the posterior pharynx.  He has a dry eschar type lesions localized to the left upper lip; clinically these do appear postherpetic. There are no lesions over the left cheek.  He has no lymphadenopathy in the head, neck, or axilla.        Assessment & Plan:  #1 clinically at this time resolving herpes simplex is suggested  #2 rosacea by history; no evidence clinically of such at this time  Plan: If a pustular lesion occurs, culture would be recommended. MetroGel and doxycycline will be prescribed for rosacea flares.

## 2011-03-16 ENCOUNTER — Other Ambulatory Visit: Payer: Self-pay | Admitting: Internal Medicine

## 2011-03-16 MED ORDER — ALLOPURINOL 300 MG PO TABS: 300.0000 mg | ORAL_TABLET | Freq: Every day | ORAL | Status: DC

## 2011-03-16 NOTE — Telephone Encounter (Signed)
RX sent

## 2011-04-02 ENCOUNTER — Other Ambulatory Visit: Payer: Self-pay | Admitting: Family Medicine

## 2011-04-02 NOTE — Telephone Encounter (Signed)
Dr.Hopper's patient.    Please advise     KP

## 2011-05-04 ENCOUNTER — Other Ambulatory Visit: Payer: Self-pay | Admitting: Family Medicine

## 2011-07-02 ENCOUNTER — Encounter: Payer: Self-pay | Admitting: Internal Medicine

## 2011-07-02 ENCOUNTER — Ambulatory Visit (INDEPENDENT_AMBULATORY_CARE_PROVIDER_SITE_OTHER): Payer: BC Managed Care – PPO | Admitting: Internal Medicine

## 2011-07-02 VITALS — BP 128/86 | HR 83 | Temp 98.5°F | Wt 187.2 lb

## 2011-07-02 DIAGNOSIS — J0110 Acute frontal sinusitis, unspecified: Secondary | ICD-10-CM

## 2011-07-02 DIAGNOSIS — R5383 Other fatigue: Secondary | ICD-10-CM

## 2011-07-02 DIAGNOSIS — J209 Acute bronchitis, unspecified: Secondary | ICD-10-CM

## 2011-07-02 DIAGNOSIS — R197 Diarrhea, unspecified: Secondary | ICD-10-CM

## 2011-07-02 LAB — CBC WITH DIFFERENTIAL/PLATELET
Eosinophils Relative: 4.9 % (ref 0.0–5.0)
HCT: 45.1 % (ref 39.0–52.0)
Hemoglobin: 15.2 g/dL (ref 13.0–17.0)
Lymphocytes Relative: 9.9 % — ABNORMAL LOW (ref 12.0–46.0)
Lymphs Abs: 1.1 10*3/uL (ref 0.7–4.0)
Monocytes Relative: 15 % — ABNORMAL HIGH (ref 3.0–12.0)
Platelets: 304 10*3/uL (ref 150.0–400.0)
WBC: 10.7 10*3/uL — ABNORMAL HIGH (ref 4.5–10.5)

## 2011-07-02 LAB — TSH: TSH: 1.16 u[IU]/mL (ref 0.35–5.50)

## 2011-07-02 MED ORDER — CEFUROXIME AXETIL 500 MG PO TABS: 500.0000 mg | ORAL_TABLET | Freq: Two times a day (BID) | ORAL | Status: AC

## 2011-07-02 NOTE — Patient Instructions (Signed)
Please take the probiotic , Align, every day until the bowels are normal. This will replace the normal bacteria which  are necessary for formation of normal stool and processing of food. Plain Mucinex for thick secretions ;force NON dairy fluids. Use a Neti pot daily as needed for sinus congestion

## 2011-07-02 NOTE — Progress Notes (Signed)
Subjective:    Patient ID: Dennis Watkins, male    DOB: 05/14/1956, 56 y.o.   MRN: [id number]  HPI #1Respiratory tract infection Onset/symptoms:2/5 as fatigue & diarrhea Exposures (illness/environmental/extrinsic):ill students Progression of symptoms:head congestion ,ST & wet cough Treatments/response:Alka Seltzer Cold, Tylenol PM , Nyquil/ some benefit Present symptoms: Fever/chills/sweats:no Frontal headache:improving Facial pain:no Nasal purulence:green in am Sore throat:5 over 10 but better Dental pain:no Lymphadenopathy:no Wheezing/shortness of breath:mild nocturnal wheezing; unable to take deep breath Cough/sputum/hemoptysis:green  Pleuritic pain:no Associated extrinsic/allergic symptoms:itchy eyes/ sneezing:no Past medical history: Seasonal allergies/asthma:both;prn MDI use, not with this illness yet Smoking history:never  #2 FATIGUE  Fatigue even @ rest  Primarily physical fatigue: yes but increased stress Symptoms: Night sweats:no                                                                                        Vision changes ( blurred/ double/ loss):  no                                                                                                Hoarseness or swallowing dysfunction: no                                                                                      Bowel changes( constipation/ diarrhea): diarrhea almost gone                                                                                      Weight change:  no  Exertional chest pain:  no Dyspnea on exertion: intermittently; no EIB  New medications: see above Leg swelling:  no Orthopnea: see above PND:  no Melena/ rectal bleeding: no Severe snoring: no Daytime sleepiness: no  Skin / hair / nail changes:no  Temperature intolerance( heat/ cold) :no  Feeling depressed:no   Anhedonia:no Altered  appetite: no Poor sleep/ Apnea : no Abnormal bruising :   no                                                                       PMH/ FH of thyroid disease:  no           Review of Systems He denies arthralgias or myalgias except gout 2 weeks ago for which he  took Colcrys. He did take a flu shot. He has not traveled internationally since December.     Objective:   Physical Exam Gen.: Healthy and well-nourished in appearance. Alert, appropriate and cooperative throughout exam. Head: Normocephalic without obvious abnormalities  Eyes: No corneal or conjunctival inflammation noted. Pupils equal round reactive to light and accommodation.  Extraocular motion intact. Eyebrows thinned laterally. No proptosis Ears: External  ear exam reveals no significant lesions or deformities. Canals : some wax .TMs normal. Hearing is grossly normal bilaterally. Nose: External nasal exam reveals no deformity or inflammation. Nasal mucosa are pink and moist. No lesions or exudates noted.  Mouth: Oral mucosa and oropharynx reveal no lesions or exudates. Teeth in good repair. Neck: No deformities, masses, or tenderness noted. Thyroid normal Lungs: Normal respiratory effort; chest expands symmetrically. Lungs are clear to auscultation without rales, wheezes, or increased work of breathing. Heart: Normal rate and rhythm. Normal S1 and S2. No gallop, click, or rub. S 4 ; no murmur. Abdomen: Bowel sounds normal; abdomen soft and nontender. No masses, organomegaly or hernias noted.                                                                               Musculoskeletal/extremities: No clubbing, cyanosis, edema, or deformity noted. Joints normal. Nail health  good. Neurologic: Alert and oriented x3. Deep tendon reflexes symmetrical and normal.          Skin: Intact without suspicious lesions or rashes. Lymph: No cervical, axillary lymphadenopathy present. Psych: Mood and affect are normal. Normally  interactive                                                                                         Assessment & Plan:  #1 rhinosinusitis with purulent secretions  #2 bronchitis acute; he describes some wheezing. Clinically there is no acute bronchospasm at this time. He has bronchodilators which is unemployed. These are recommended every 4 hours as needed for wheezing.  #3 diarrhea, essentially resolved. This would impact choice of antibiotic therapy  #4 fatigue; he relates that to his present illness and life stresses. Clinically thyroid  dysfunction is not suggested.  Plan: See orders and recommendations

## 2011-07-13 ENCOUNTER — Telehealth: Payer: Self-pay | Admitting: Internal Medicine

## 2011-07-13 ENCOUNTER — Telehealth: Payer: Self-pay

## 2011-07-13 NOTE — Telephone Encounter (Signed)
Dr.Hopper please advise 

## 2011-07-13 NOTE — Telephone Encounter (Signed)
Opened in error. bc °

## 2011-07-13 NOTE — Telephone Encounter (Signed)
Seen 07/02/11 and he had completed medication still having sinusitis. He stated he has a cough, sinus drainage, green phlegm,sinus pressure and when he sneezes he feels like there is mucus in his ear. Patient does not have a fever. He would like to know if he should come in again, if you  Can give him another Abx or if you have any recommendations. Please advise    KP

## 2011-07-13 NOTE — Telephone Encounter (Signed)
Patient returned call

## 2011-07-13 NOTE — Telephone Encounter (Signed)
Clarithromycin XL 500 mg 2 daily with food dispense 14. Office visit if no better

## 2011-07-13 NOTE — Telephone Encounter (Signed)
Left message to call office

## 2011-07-14 MED ORDER — CLARITHROMYCIN ER 500 MG PO TB24: 1000.0000 mg | ORAL_TABLET | Freq: Every day | ORAL | Status: AC

## 2011-07-14 NOTE — Telephone Encounter (Signed)
Discuss with patient, Rx sent. 

## 2011-09-22 ENCOUNTER — Ambulatory Visit: Payer: BC Managed Care – PPO | Admitting: Family Medicine

## 2011-09-23 ENCOUNTER — Ambulatory Visit (HOSPITAL_BASED_OUTPATIENT_CLINIC_OR_DEPARTMENT_OTHER)
Admission: RE | Admit: 2011-09-23 | Discharge: 2011-09-23 | Disposition: A | Discharge status: Home / Self Care | Payer: BC Managed Care – PPO | Source: Ambulatory Visit | Attending: Family Medicine | Admitting: Family Medicine

## 2011-09-23 ENCOUNTER — Ambulatory Visit (INDEPENDENT_AMBULATORY_CARE_PROVIDER_SITE_OTHER): Payer: BC Managed Care – PPO | Admitting: Family Medicine

## 2011-09-23 ENCOUNTER — Encounter: Payer: Self-pay | Admitting: Family Medicine

## 2011-09-23 VITALS — BP 137/86 | HR 87 | Temp 98.3°F | Ht 70.0 in | Wt 175.0 lb

## 2011-09-23 DIAGNOSIS — S82899A Other fracture of unspecified lower leg, initial encounter for closed fracture: Secondary | ICD-10-CM | POA: Insufficient documentation

## 2011-09-23 DIAGNOSIS — X58XXXA Exposure to other specified factors, initial encounter: Secondary | ICD-10-CM | POA: Insufficient documentation

## 2011-09-23 DIAGNOSIS — S8990XA Unspecified injury of unspecified lower leg, initial encounter: Secondary | ICD-10-CM

## 2011-09-23 DIAGNOSIS — IMO0002 Reserved for concepts with insufficient information to code with codable children: Secondary | ICD-10-CM

## 2011-09-23 DIAGNOSIS — S99912A Unspecified injury of left ankle, initial encounter: Secondary | ICD-10-CM

## 2011-09-24 ENCOUNTER — Encounter: Payer: Self-pay | Admitting: Family Medicine

## 2011-09-24 DIAGNOSIS — S99912A Unspecified injury of left ankle, initial encounter: Secondary | ICD-10-CM | POA: Insufficient documentation

## 2011-09-24 NOTE — Assessment & Plan Note (Signed)
radiographs and exam consistent with avulsion fracture of lateral malleolus below level of ankle joint, intact mortise.  Should heal well over the next 6 weeks.  Offered patient cam walker or ASO for support (walking cast unnecessary for these types of fractures).  He states he feels comfortable in a supportive shoe.  Advised to take care around hills and uneven ground.  Icing, ace wrap, tylenol, nsaids, elevation discussed.  He is leaving for [place] in 2 [place] (for 3 weeks total) and will follow-up with [place] after his trip.

## 2011-09-24 NOTE — Progress Notes (Signed)
Subjective:    Patient ID: Dennis Watkins, male    DOB: 10/23/1955, 56 y.o.   MRN: [id number]  PCP: Dr. Alwyn Ren  HPI 56 yo M here for left foot/ankle injury.  Patient reports on 4/25 while playing softball he suffered injury to lateral left ankle. States after hitting the ball, went to drop the bat and dropped it hard directly on outside of left ankle. Started limping right after this, had to stop playing. + swelling but no bruising. Has been taking ibuprofen and icing. No prior left ankle/foot injuries.  Past Medical History  Diagnosis Date  . PUD (peptic ulcer disease) 1994  . 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 Asthma     as child;essentially resolved in teens  . Hyperlipidemia   . Gout     [facility]  . NASH (nonalcoholic steatohepatitis)     [facility]    Current Outpatient Prescriptions on File Prior to Visit  Medication Sig Dispense Refill  . allopurinol (ZYLOPRIM) 300 MG tablet Take 1 tablet (300 mg total) by mouth daily.  90 tablet  1  . aspirin 81 MG tablet Take 81 mg by mouth daily.        Marland Kitchen azelastine (ASTEPRO) 137 MCG/SPRAY nasal spray 1 spray by Nasal route 2 (two) times daily. Use in each nostril as directed       . budesonide-formoterol (SYMBICORT) 160-4.5 MCG/ACT inhaler Inhale 2 puffs into the lungs 2 (two) times daily.  1 Inhaler  3  . colchicine 0.6 MG tablet Take 1 tablet (0.6 mg total) by mouth 2 (two) times daily as needed.  30 tablet  1  . fluticasone (FLONASE) 50 MCG/ACT nasal spray Place 2 sprays into the nose daily. Each nostril       . Loratadine (CLARITIN) 10 MG CAPS Take 10 mg by mouth daily.        . metroNIDAZOLE (METROGEL) 1 % gel Apply topically daily as needed.      . Mometasone Furo-Formoterol Fum (DULERA) 200-5 MCG/ACT AERO Inhale 200 mcg into the lungs. 1-2 puffs every 12 hrs;gargle & spit after use       . montelukast (SINGULAIR) 10 MG tablet TAKE 1 TABLET BY MOUTH EVERY DAY AS NEEDED  30 tablet  5  . Multiple Vitamin (MULTIVITAMIN) tablet Take  1 tablet by mouth daily.        . VENTOLIN HFA 108 (90 BASE) MCG/ACT inhaler USE AS NEEDED  18 g  5    Past Surgical History  Procedure Date  . Tonsillectomy   . Nasal septum surgery   . Upper gastrointestinal endoscopy     No Known Allergies  History   Social History  . Marital Status: Divorced    Spouse Name: N/A    Number of Children: N/A  . Years of Education: N/A   Occupational History  . 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 Smoking status: Never Smoker   . Smokeless tobacco: Not on file  . Alcohol Use: Yes     minimally  . Drug Use: No  . Sexually Active: Not on file   Other Topics Concern  . Not on file   Social History Narrative   SingleRegular exercise;biking walking 2-3 xweekly    Family History  Problem Relation Age of Onset  . Cancer Mother     breast  . Depression Father   . Heart disease Father     cabg,MI @  70  . Hypertension Father   . Gout Father     BP 137/86  Pulse 87  Temp(Src) 98.3 F (36.8 C) (Oral)  Ht 5\' 10"  (1.778 m)  Wt 175 lb (79.379 kg)  BMI 25.11 kg/m2 Review of Systems See HPI above.    Objective:   Physical Exam Gen: NAD  L ankle: Mild swelling, palpable ridge lateral malleolus.  No bruising.  No other deformity. FROM with pain on ER - able to resist all motions. TTP at lateral malleolus.  Less TTP peroneal tendons.  No ATFL, med malleolus, navicular, base 5th, fibular head, other TTP. Negative ant drawer.  Painful talar tilt.   Negative syndesmotic compression. Thompsons test negative. NV intact distally.  R ankle: FROM without pain, instability.     Assessment & Plan:  1. Left ankle injury - radiographs and exam consistent with avulsion fracture of lateral malleolus below level of ankle joint, intact mortise.  Should heal well over the next 6 weeks.  Offered patient cam walker or ASO for support (walking cast unnecessary for these types of fractures).  He states he feels comfortable in a  supportive shoe.  Advised to take care around hills and uneven ground.  Icing, ace wrap, tylenol, nsaids, elevation discussed.  He is leaving for [place] in 2 weeks (for 3 weeks total) and will follow-up with [place] after his trip.

## 2011-12-21 ENCOUNTER — Other Ambulatory Visit: Payer: Self-pay | Admitting: Internal Medicine

## 2011-12-22 NOTE — Telephone Encounter (Signed)
Uric Acid 274.9/BMP 995.20

## 2012-02-25 ENCOUNTER — Ambulatory Visit: Payer: BC Managed Care – PPO | Admitting: Internal Medicine

## 2012-02-25 ENCOUNTER — Telehealth: Payer: Self-pay

## 2012-02-25 ENCOUNTER — Ambulatory Visit (INDEPENDENT_AMBULATORY_CARE_PROVIDER_SITE_OTHER): Payer: BC Managed Care – PPO | Admitting: Internal Medicine

## 2012-02-25 ENCOUNTER — Encounter: Payer: Self-pay | Admitting: Internal Medicine

## 2012-02-25 VITALS — BP 122/80 | HR 98 | Temp 98.2°F | Wt 183.6 lb

## 2012-02-25 DIAGNOSIS — J310 Chronic rhinitis: Secondary | ICD-10-CM

## 2012-02-25 DIAGNOSIS — J209 Acute bronchitis, unspecified: Secondary | ICD-10-CM

## 2012-02-25 MED ORDER — CEFUROXIME AXETIL 500 MG PO TABS: 500.0000 mg | ORAL_TABLET | Freq: Two times a day (BID) | ORAL | Status: DC

## 2012-02-25 MED ORDER — FLUTICASONE PROPIONATE 50 MCG/ACT NA SUSP: 1.0000 | Freq: Two times a day (BID) | NASAL | Status: DC | PRN

## 2012-02-25 NOTE — Telephone Encounter (Signed)
Pt wanted to come in earlier than 4:30pm referred pt to scheduling.       MW

## 2012-02-25 NOTE — Progress Notes (Signed)
  Subjective:    Patient ID: Dennis Watkins, male    DOB: 06-28-1955, 56 y.o.   MRN: [id number]  HPI Symptoms began 02/20/12 as malaise associated with some dizziness, especially posturally. He was been down 9/30 and 10/1; he noted the onset of a cough which was initially dry but subsequently "wet". This was associated with some postnasal drainage. Respirations were somewhat "shallow"; this improved using Ventolin and and Advair.  The fatigue has improved somewhat.  He also questions early stye formation in the right eye.    Review of Systems   He denies any significant extrinsic symptoms of watery, itchy eyes or sneezing. He also has not had frontal headache, facial pain, or nasal purulence. This     Objective:   Physical Exam General appearance:good health ;well nourished; no acute distress or increased work of breathing is present.  No  lymphadenopathy about the head, neck, or axilla noted.   Eyes: No conjunctival inflammation or lid edema is present. Small sty OD lower lid  Ears:  External ear exam shows no significant lesions or deformities.  Otoscopic examination reveals wax bilaterally; L > R Nose:  External nasal examination shows no deformity or inflammation. Nasal mucosa are pink and moist without lesions or exudates. No septal dislocation or deviation.No obstruction to airflow.   Oral exam: Dental hygiene is good; lips and gums are healthy appearing.There is no oropharyngeal erythema or exudate noted.   Neck:  No deformities, masses, or tenderness noted.     Heart:  Normal rate and regular rhythm. S1 and S2 normal without gallop, murmur, click, rub or other extra sounds. S4  Lungs:Chest clear to auscultation; no wheezes, rhonchi,rales ,or rubs present.No increased work of breathing.Harsh, non productive cough    Extremities:  No cyanosis, edema, or clubbing  noted    Skin: Warm & dry          Assessment & Plan:  #1 bronchitis, acute; etiology most likely atypical  bacteria. No criteria for rhinosinusitis  Plan: See orders and recommendations

## 2012-02-25 NOTE — Patient Instructions (Addendum)
Plain Mucinex for thick secretions ;force NON dairy fluids . Use a Neti pot daily as needed for sinus congestion; going from open side to congested side . Nasal cleansing in the shower as discussed. Make sure that all residual soap is removed to prevent irritation. Fluticasone 1 spray in each nostril twice a day as needed. Use the "crossover" technique as discussed. Plain Allegra 160 daily as needed for itchy eyes & sneezing. Advair one inhalation every 12 hours; gargle and spit after use

## 2012-03-19 ENCOUNTER — Other Ambulatory Visit: Payer: Self-pay | Admitting: Internal Medicine

## 2012-03-21 NOTE — Telephone Encounter (Signed)
Refill done.  

## 2012-03-21 NOTE — Telephone Encounter (Signed)
274.9 Uric Acid/BMP

## 2012-04-05 ENCOUNTER — Encounter: Payer: Self-pay | Admitting: Internal Medicine

## 2012-04-05 ENCOUNTER — Ambulatory Visit (INDEPENDENT_AMBULATORY_CARE_PROVIDER_SITE_OTHER): Payer: BC Managed Care – PPO | Admitting: Internal Medicine

## 2012-04-05 VITALS — BP 130/84 | HR 85 | Temp 97.9°F | Wt 186.0 lb

## 2012-04-05 DIAGNOSIS — K900 Celiac disease: Secondary | ICD-10-CM

## 2012-04-05 NOTE — Progress Notes (Signed)
Subjective:    Patient ID: Dennis Watkins, male    DOB: 01-Apr-1956, 56 y.o.   MRN: [id number]  HPI FATIGUE Onset: > 1 year Character: "jet lag like". He has traveled internationally frequently in past but not in the recent past.  Fatigue >  @ rest than with  exertion , especially in afternoon or @ night  Primarily motivational fatigue: no  Primarily physical fatigue: yes  Past medical history/family history/social history were all reviewed and updated. Pertinent data: Celiac Sprue    Review of Systems  Fever/ chills : no Night sweats: no                                                                                            Vision changes ( blurred/ double/ loss): no                                                                                                 Hoarseness or swallowing dysfunction: no                                                                                       Bowel changes( constipation/ diarrhea): no                                                                                     Weight change: fluctuates 10 # based on diet Exertional chest pain: no  Dyspnea on exertion: no Cough: no Hemoptysis: no New medications: no Leg swelling: no Orthopnea: no  PND: no Melena/ rectal bleeding:no Adenopathy:no Severe snoring:no  Daytime sleepiness: occasionally  Skin / hair / nail changes: no  Temperature intolerance( heat/ cold) :no  Feeling depressed: no Anhedonia: no Altered appetite: no Poor sleep/ Apnea : waking up 3-4 am X 6 months  Abnormal bruising / bleeding or enlarged lymph nodes: no                                                                        PMH/ FH of thyroid disease: no    Objective:   Physical Exam Gen.:  well-nourished in appearance. Alert, appropriate and cooperative throughout exam. Head: Normocephalic without obvious abnormalities .  Goatee Eyes: No corneal or conjunctival inflammation noted.  Extraocular motion intact. No icterus Ears: External  ear exam reveals no significant lesions or deformities. Canals clear .TMs normal. Hearing is grossly normal bilaterally. Nose: External nasal exam reveals no deformity or inflammation. Nasal mucosa are pink and moist. No lesions or exudates noted.  Mouth: Oral mucosa and oropharynx reveal no lesions or exudates. Teeth in good repair. Neck: No deformities, masses, or tenderness noted. Range of motion & Thyroid normal Lungs: Normal respiratory effort; chest expands symmetrically. Lungs are clear to auscultation without rales, wheezes, or increased work of breathing. Heart: Normal rate and rhythm. Normal S1 and S2. Slow S 4 gallop. No click,  Rub,or murmur. Abdomen: Bowel sounds normal; abdomen soft and nontender. No masses, organomegaly or hernias noted.                                                                                Musculoskeletal/extremities:  No clubbing, cyanosis, edema, or significant  deformity noted. Joints normal. Nail health  good. Vascular: Carotid, radial artery, dorsalis pedis and  posterior tibial pulses are full and equal. No bruits present. Neurologic: Alert and oriented x3. Deep tendon reflexes symmetrical and normal.         Skin: Intact without suspicious lesions or rashes. Lymph: No cervical, axillary lymphadenopathy present. Psych: Mood and affect are normal. Normally interactive                                                                                         Assessment & Plan:  #1 fatigue, mainly at rest, especially in the afternoon and evening  #2 sleep disruption with early morning awakening. No history of excessive snoring or apnea  #3 biopsy documented sprue  Plan: Fasting labs recommended with complete thyroid function tests. If these are normal; it is recommended that he be referred for sleep evaluation.

## 2012-04-05 NOTE — Patient Instructions (Addendum)
Please  schedule fasting Labs : BMET,Lipids, hepatic panel, CBC & dif,free T 4, free T 3, & TSH.  PLEASE BRING THESE INSTRUCTIONS TO FOLLOW UP  LAB APPOINTMENT.This will guarantee correct labs are drawn, eliminating need for repeat blood sampling ( needle sticks ! ). Diagnoses /Codes: 780.79. If you are on My Chart; the results can be released to you as soon as they populate from the lab. If you choose not to use this program; the labs have to be reviewed, copied & mailed causing a delay in getting the results to you.

## 2012-04-20 ENCOUNTER — Other Ambulatory Visit (INDEPENDENT_AMBULATORY_CARE_PROVIDER_SITE_OTHER): Payer: BC Managed Care – PPO

## 2012-04-20 DIAGNOSIS — R5381 Other malaise: Secondary | ICD-10-CM

## 2012-04-20 DIAGNOSIS — R5383 Other fatigue: Secondary | ICD-10-CM

## 2012-04-20 LAB — BASIC METABOLIC PANEL
BUN: 14 mg/dL (ref 6–23)
CO2: 23 mEq/L (ref 19–32)
Calcium: 8.8 mg/dL (ref 8.4–10.5)
Creatinine, Ser: 0.8 mg/dL (ref 0.4–1.5)

## 2012-04-20 LAB — CBC WITH DIFFERENTIAL/PLATELET
Basophils Relative: 1.2 % (ref 0.0–3.0)
Eosinophils Relative: 8.6 % — ABNORMAL HIGH (ref 0.0–5.0)
HCT: 45.8 % (ref 39.0–52.0)
Hemoglobin: 15.2 g/dL (ref 13.0–17.0)
Lymphocytes Relative: 21.6 % (ref 12.0–46.0)
MCHC: 33.1 g/dL (ref 30.0–36.0)
MCV: 91.5 fl (ref 78.0–100.0)
Neutrophils Relative %: 58.7 % (ref 43.0–77.0)

## 2012-04-20 LAB — TSH: TSH: 2.04 u[IU]/mL (ref 0.35–5.50)

## 2012-04-20 LAB — LIPID PANEL
Total CHOL/HDL Ratio: 5
Triglycerides: 67 mg/dL (ref 0.0–149.0)

## 2012-04-20 LAB — HEPATIC FUNCTION PANEL
AST: 25 U/L (ref 0–37)
Albumin: 4 g/dL (ref 3.5–5.2)
Alkaline Phosphatase: 60 U/L (ref 39–117)
Bilirubin, Direct: 0.1 mg/dL (ref 0.0–0.3)

## 2012-04-20 LAB — T3, FREE: T3, Free: 4.2 pg/mL (ref 2.3–4.2)

## 2012-05-20 ENCOUNTER — Ambulatory Visit (INDEPENDENT_AMBULATORY_CARE_PROVIDER_SITE_OTHER): Payer: BC Managed Care – PPO | Admitting: Family Medicine

## 2012-05-20 ENCOUNTER — Encounter: Payer: Self-pay | Admitting: Family Medicine

## 2012-05-20 VITALS — BP 120/82 | HR 125 | Temp 99.0°F | Wt 192.0 lb

## 2012-05-20 DIAGNOSIS — J209 Acute bronchitis, unspecified: Secondary | ICD-10-CM | POA: Insufficient documentation

## 2012-05-20 MED ORDER — AMOXICILLIN 875 MG PO TABS: 875.0000 mg | ORAL_TABLET | Freq: Two times a day (BID) | ORAL | Status: DC

## 2012-05-20 NOTE — Assessment & Plan Note (Signed)
New.  Pt reports he has similar sxs ~1x/yr.  Start abx.  Continue OTC cough meds prn.  Reviewed supportive care and red flags that should prompt return.  Pt expressed understanding and is in agreement w/ plan.

## 2012-05-20 NOTE — Patient Instructions (Addendum)
Start the Amoxicillin twice daily (w/ food) for the bronchitis Drink plenty of fluids Mucinex DM to thin your congestion and help w/ cough REST!! Call with any questions or concerns Happy New Year!  Safe Travels!

## 2012-05-20 NOTE — Progress Notes (Signed)
  Subjective:    Patient ID: Dennis Watkins, male    DOB: [birth year], 56 y.o.   MRN: [id number]  HPI URI- sxs started 4 days ago w/ 'very thick nasal mucous' and 'raspy cough'.  Cough is now productive of white sputum.  + HA, mild body aches.  Taking OTC cough and cold meds w/ some relief.  Subjective low grade temp, 'really blah'- low energy.  Has upcoming trip.  No ear pain.  + maxillary pressure.  No known sick contacts.  Hx of asthma.   Review of Systems For ROS see HPI     Objective:   Physical Exam  Constitutional: He is oriented to person, place, and time. He appears well-developed and well-nourished. No distress.  HENT:  Head: Normocephalic and atraumatic.  Right Ear: Tympanic membrane normal.  Left Ear: Tympanic membrane normal.  Nose: No mucosal edema or rhinorrhea. Right sinus exhibits no maxillary sinus tenderness and no frontal sinus tenderness. Left sinus exhibits no maxillary sinus tenderness and no frontal sinus tenderness.  Mouth/Throat: Mucous membranes are normal. No oropharyngeal exudate, posterior oropharyngeal edema or posterior oropharyngeal erythema.  Eyes: Conjunctivae normal and EOM are normal. Pupils are equal, round, and reactive to light.  Neck: Normal range of motion. Neck supple. No thyromegaly present.  Cardiovascular: Normal rate, regular rhythm, normal heart sounds and intact distal pulses.   No murmur heard. Pulmonary/Chest: Effort normal and breath sounds normal. No respiratory distress. He has no wheezes.       + hacking cough  Lymphadenopathy:    He has no cervical adenopathy.  Neurological: He is alert and oriented to person, place, and time.  Skin: Skin is warm and dry.  Psychiatric: He has a normal mood and affect. His behavior is normal.          Assessment & Plan:

## 2012-05-23 ENCOUNTER — Telehealth: Payer: Self-pay | Admitting: Internal Medicine

## 2012-05-23 NOTE — Telephone Encounter (Signed)
[facility] Triage Call Report Triage Record Num: [id number] Operator: Rebeca Allegra Patient Name: Dennis Watkins Call Date & Time: 05/22/2012 2:32:04AM Patient Phone: [phone] PCP: Patient Gender: Male PCP Fax : Patient DOB: Apr 06, 1956 Practice Name: [facility] Reason for Call: Caller: [name]/Patient; PCP: Marga Melnick; CB#: [phone]; Call regarding Medication Issue; Medication(s): Allopurinol; pt states he is leaving for [place] 05/22/12 0900 and is out of refills and only has 1 tablet left. Per standing orders for refills, only enough can be until next business day. RN spoke with Dr. Fabian Sharp who authorized 1 refill, with no additional refills. Advised pt to f/u with office upon returning to the states. Protocol(s) Used: Office Note Recommended Outcome per Protocol: Information Noted and Sent to Office Reason for Outcome: Caller information to office Care Advice: ~

## 2012-06-09 ENCOUNTER — Encounter: Payer: Self-pay | Admitting: Internal Medicine

## 2012-06-09 ENCOUNTER — Ambulatory Visit (INDEPENDENT_AMBULATORY_CARE_PROVIDER_SITE_OTHER): Payer: BC Managed Care – PPO | Admitting: Internal Medicine

## 2012-06-09 VITALS — BP 140/92 | HR 87 | Temp 98.2°F | Wt 191.0 lb

## 2012-06-09 DIAGNOSIS — J9801 Acute bronchospasm: Secondary | ICD-10-CM

## 2012-06-09 DIAGNOSIS — J45909 Unspecified asthma, uncomplicated: Secondary | ICD-10-CM

## 2012-06-09 MED ORDER — BUDESONIDE-FORMOTEROL FUMARATE 160-4.5 MCG/ACT IN AERO: 2.0000 | INHALATION_SPRAY | Freq: Two times a day (BID) | RESPIRATORY_TRACT | Status: DC | PRN

## 2012-06-09 MED ORDER — AZITHROMYCIN 250 MG PO TABS: ORAL_TABLET | ORAL | Status: DC

## 2012-06-09 NOTE — Patient Instructions (Addendum)

## 2012-06-09 NOTE — Progress Notes (Signed)
  Subjective:    Patient ID: Dennis Watkins, male    DOB: [birth year], 57 y.o.   MRN: [id number]  HPI The respiratory tract symptoms began  05/16/12  as  dry cough with progression to clear sputum.  Amoxicillin Rxed here 12/27  helped some.  Cough now associated with white sputum with  shortness of breath but no wheezing .  Treatment with   Tylenol,Mucinex DM, & Sudafed was partially effective. He's been using his albuterol at least twice a day. He has not been using any maintenance agent  There is  history of asthma ; it has flared as above. Ventolin has helped as did nebulized albuterol.The patient had never smoked               Review of Systems Symptoms not present included frotal headache, facial pain, dental pain, sore throat, nasal purulence, earache , and otic discharge  Fever , chills and sweats not present   Itchy , watery eyes & sneezing were not significant.    Objective:   Physical Exam  General appearance:good health ;well nourished; no acute distress or increased work of breathing is present.  No  lymphadenopathy about the head, neck, or axilla noted.  Eyes: No conjunctival inflammation or lid edema is present. There is no scleral icterus. Ears:  External ear exam shows no significant lesions or deformities.  Otoscopic examination reveals some wax bilaterally Nose:  External nasal examination shows no deformity or inflammation. Nasal mucosa are pink and moist without lesions or exudates. Slight L septal dislocation .No obstruction to airflow.  Oral exam: Dental hygiene is good; lips and gums are healthy appearing.There is no oropharyngeal erythema or exudate noted.  Neck:  No deformities, masses, or tenderness noted.    Heart:  Normal rate and regular rhythm. S1 and S2 normal without gallop, murmur, click, rub or other extra sounds.  Lungs:Chest clear to auscultation; no wheezes, rhonchi,rales ,or rubs present.No increased work of breathing. Decreased BS     Extremities:  No cyanosis, edema, or clubbing  noted  Skin: Warm & dry .            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

## 2012-06-10 ENCOUNTER — Telehealth: Payer: Self-pay

## 2012-06-10 DIAGNOSIS — J40 Bronchitis, not specified as acute or chronic: Secondary | ICD-10-CM

## 2012-06-10 MED ORDER — DOXYCYCLINE HYCLATE 100 MG PO TABS: 100.0000 mg | ORAL_TABLET | Freq: Two times a day (BID) | ORAL | Status: DC

## 2012-06-10 NOTE — Telephone Encounter (Signed)
Script for doxycycline sent to pharmacy. Z-pak discontinued per MD order

## 2012-06-10 NOTE — Telephone Encounter (Signed)
Spoke with patient, patient aware MD will change ABX. Patient would like rx sent to [facility]

## 2012-06-10 NOTE — Telephone Encounter (Signed)
Message left on VM, patient seen MD in [place] and was given a Zpak not too long ago, patient was also given a Zpak yesterday when seen by [facility]. Patient would like to know if another rx would be more effective since he just had a round of Zpak

## 2012-06-10 NOTE — Telephone Encounter (Signed)
Doxycycline 100 mg bid # 14 in lieu of Z pack

## 2012-07-07 ENCOUNTER — Other Ambulatory Visit: Payer: Self-pay | Admitting: Internal Medicine

## 2012-07-11 NOTE — Telephone Encounter (Signed)
Last OV 03-19-12 #30, last 06-09-12

## 2012-07-11 NOTE — Telephone Encounter (Signed)
OK , R x 3

## 2012-10-24 ENCOUNTER — Other Ambulatory Visit: Payer: Self-pay | Admitting: Internal Medicine

## 2012-10-25 ENCOUNTER — Other Ambulatory Visit: Payer: Self-pay | Admitting: Internal Medicine

## 2012-12-23 ENCOUNTER — Other Ambulatory Visit: Payer: Self-pay | Admitting: Internal Medicine

## 2012-12-23 NOTE — Telephone Encounter (Signed)
Uric Arid/BMP (DX- Gout)

## 2012-12-27 ENCOUNTER — Telehealth: Payer: Self-pay | Admitting: Internal Medicine

## 2012-12-27 NOTE — Telephone Encounter (Signed)
Patient is out of town in [place], patient advised that unfortunately he will need to be seen @ Urgent Care, MD is unable to treat/advise without seeing patient. Patient advise to take imodium for diarrhea, if symptoms do not resolve patient will need to be evaluated. Patient verbalized understanding.  Will forward note to MD as a Lorain Childes

## 2012-12-27 NOTE — Telephone Encounter (Signed)
Patient Information:  Caller Name: Khai  Phone: [phone]  Patient: Dennis Watkins, Dennis Watkins  Gender: Male  DOB: Dec 20, 1955  Age: 57 Years  PCP: Marga Melnick  Office Follow Up:  Does the office need to follow up with this patient?: Yes  Instructions For The Office: Requests call back 12/27/12 regarding med quesitons; please see note.  RN Note:  Knee feels stiff but can walk. Knee pain rated 6/10 while walking and moving around; earlier pain was 7-8/10. Returning home 01/01/13.  Advised to see UC in [place].   Also asked what to do about watery diarrhea every few hours since 12/26/12 that he suspects is from Allopurinol. Advised to hydrate with water and oral rehydration solution and discuss with MD at UC since it may be related to the higher doses he is taking of the Colchicine.  Requests Dr Alwyn Ren or his nurse be asked to repond to medication questions including request for Prednisone without office visit.  Urgent message sent to office staff.   Symptoms  Reason For Call & Symptoms: Gout attack in left knee with swelling, heat, pain.  Using Colchine and Allopurinol.  Asking if needs Prednisone. Currently, in [place] on vacation.  Reviewed Health History In EMR: Yes  Reviewed Medications In EMR: Yes  Reviewed Allergies In EMR: Yes  Reviewed Surgeries / Procedures: Yes  Date of Onset of Symptoms: 12/25/2012  Treatments Tried: Allopurinol, Colchine, elevating and resting knee, ice  Treatments Tried Worked: No  Guideline(s) Used:  Knee Pain  Disposition Per Guideline:   See Today in Office  Reason For Disposition Reached:   Very swollen joint  Advice Given:  Rest Your Knee   for the next couple days. Avoid activities that worsen your pain. Reduce activities that put a lot of strain on the knee joint (e.g., deep knee bends, stair climbing, running).  Call Back If:  Knee pain lasts longer than 7 days  You become worse.  RN Overrode Recommendation:  Go To [facility]  Refused to go to [facility] in  [place].  Requests call back from Dr Alwyn Ren or his nurse regarding medication questions.

## 2013-01-17 ENCOUNTER — Other Ambulatory Visit: Payer: Self-pay | Admitting: Internal Medicine

## 2013-01-24 ENCOUNTER — Encounter: Payer: Self-pay | Admitting: Internal Medicine

## 2013-01-24 ENCOUNTER — Ambulatory Visit (INDEPENDENT_AMBULATORY_CARE_PROVIDER_SITE_OTHER): Payer: BC Managed Care – PPO | Admitting: Internal Medicine

## 2013-01-24 VITALS — BP 127/91 | HR 94 | Temp 98.8°F | Wt 194.6 lb

## 2013-01-24 DIAGNOSIS — J209 Acute bronchitis, unspecified: Secondary | ICD-10-CM

## 2013-01-24 MED ORDER — AMOXICILLIN 500 MG PO CAPS: 1000.0000 mg | ORAL_CAPSULE | Freq: Two times a day (BID) | ORAL | Status: DC

## 2013-01-24 MED ORDER — MOMETASONE FUROATE 50 MCG/ACT NA SUSP: 2.0000 | Freq: Every day | NASAL | Status: DC

## 2013-01-24 NOTE — Patient Instructions (Addendum)
Rest, fluids , tylenol For cough, take Mucinex DM twice a day as needed  For congestion use nasonex nasal spray once a day until you feel better Take the antibiotic as prescribed  (Amoxicillin) Use albuterol as needed  Call if no better in few days Call anytime if the symptoms are severe

## 2013-01-24 NOTE — Progress Notes (Signed)
  Subjective:    Patient ID: Dennis Watkins, male    DOB: [birth year], 57 y.o.   MRN: [id number]  HPI Acute visit due to respiratory symptoms Symptoms started last week shortly before he took a trip to [facility]. Reports sinus and facial "thickness and congestion", yellow color postnasal dripping, some headaches, also cough;  was exposed to tobacco at [facility] Has been using Ventolin and 2 or 3 times a day.  Past Medical History  Diagnosis Date  . PUD (peptic ulcer disease) 1994  . 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 Asthma     as child;essentially resolved in teens  . Hyperlipidemia   . Gout     [facility] of  . NASH (nonalcoholic steatohepatitis)     [facility] of   Past Surgical History  Procedure Laterality Date  . Tonsillectomy    . Nasal septum surgery    . Upper gastrointestinal endoscopy  1997 & 2007     Review of Systems Subjective fever? No chills. Note nausea, vomiting, diarrhea. No shortness of breath or chest pain although he did say his breathing is a little shallow.    Objective:   Physical Exam BP 127/91  Pulse 94  Temp(Src) 98.8 F (37.1 C)  Wt 194 lb 9.6 oz (88.27 kg)  BMI 27.92 kg/m2  SpO2 97% General -- alert, well-developed, NAD.   HEENT-- Not pale. TMs : Rnormal, L obscured by wax; throat symmetric, no redness or discharge. Face symmetric, sinuses not tender to palpation. Nose ++ congested but not d/c.  Lungs -- normal respiratory effort, no intercostal retractions, no accessory muscle use, and few rhonchi; No crackles or wheezing per se Heart-- normal rate, regular rhythm, no murmur.   Extremities-- no pretibial edema bilaterally  Psych-- Cognition and judgment appear intact. Alert and cooperative with normal attention span and concentration. not anxious appearing and not depressed appearing.       Assessment & Plan:  Bronchitis, mild sinusitis. 57 year old gentleman with history of asthma presents with upper respiratory symptoms, he is in no distress,  likely he has bronchitis and mild sinusitis. Plan: see  instructions

## 2013-02-01 ENCOUNTER — Other Ambulatory Visit: Payer: Self-pay | Admitting: Internal Medicine

## 2013-02-10 ENCOUNTER — Other Ambulatory Visit: Payer: Self-pay | Admitting: Internal Medicine

## 2013-02-15 NOTE — Telephone Encounter (Signed)
Med filled.  

## 2013-03-20 ENCOUNTER — Other Ambulatory Visit: Payer: Self-pay | Admitting: Internal Medicine

## 2013-03-20 NOTE — Telephone Encounter (Signed)
Fluticasone refill sent to pharmacy

## 2013-03-27 ENCOUNTER — Other Ambulatory Visit: Payer: Self-pay | Admitting: Internal Medicine

## 2013-03-27 NOTE — Telephone Encounter (Signed)
Fluticasone and Ventolin refills sent to pharmacy

## 2013-03-30 ENCOUNTER — Other Ambulatory Visit: Payer: Self-pay

## 2013-07-23 ENCOUNTER — Other Ambulatory Visit: Payer: Self-pay | Admitting: Internal Medicine

## 2013-08-10 ENCOUNTER — Encounter: Payer: Self-pay | Admitting: Family Medicine

## 2013-08-10 ENCOUNTER — Ambulatory Visit (INDEPENDENT_AMBULATORY_CARE_PROVIDER_SITE_OTHER): Payer: BC Managed Care – PPO | Admitting: Family Medicine

## 2013-08-10 VITALS — BP 154/86 | HR 108 | Temp 98.3°F | Wt 197.0 lb

## 2013-08-10 DIAGNOSIS — J45901 Unspecified asthma with (acute) exacerbation: Secondary | ICD-10-CM

## 2013-08-10 MED ORDER — METHYLPREDNISOLONE ACETATE 80 MG/ML IJ SUSP
80.0000 mg | Freq: Once | INTRAMUSCULAR | Status: AC
  Administered 2013-08-10: 80 mg via INTRAMUSCULAR

## 2013-08-10 MED ORDER — PREDNISONE 10 MG PO TABS: ORAL_TABLET | ORAL | Status: DC

## 2013-08-10 NOTE — Progress Notes (Signed)
  Subjective:     Dennis Watkins is a 58 y.o. male here for evaluation of a cough. Onset of symptoms was 4 days ago. Symptoms have been gradually worsening since that time. The cough is productive and is aggravated by dust, pollens and reclining position. Associated symptoms include: shortness of breath, sputum production and wheezing. Patient does have a history of asthma. Patient does have a history of environmental allergens. Patient has not traveled recently. Patient does not have a history of smoking. Patient has not had a previous chest x-ray. Patient has not had a PPD done.  The following portions of the patient's history were reviewed and updated as appropriate: allergies, current medications, past family history, past medical history, past social history, past surgical history and problem list.  Review of Systems Pertinent items are noted in HPI.    Objective:    Oxygen saturation 96% on room air BP 154/86  Pulse 108  Temp(Src) 98.3 F (36.8 C) (Oral)  Wt 197 lb (89.359 kg)  SpO2 96% General appearance: alert, cooperative, appears stated age and no distress Ears: normal TM's and external ear canals both ears Nose: Nares normal. Septum midline. Mucosa normal. No drainage or sinus tenderness. Throat: lips, mucosa, and tongue normal; teeth and gums normal Neck: no adenopathy, supple, symmetrical, trachea midline and thyroid not enlarged, symmetric, no tenderness/mass/nodules Lungs: diminished breath sounds bilaterally Heart: S1, S2 normal    Assessment:    Asthma    Plan:    Explained lack of efficacy of antibiotics in viral disease. Antitussives per medication orders. Avoid exposure to tobacco smoke and fumes. B-agonist inhaler. Call if shortness of breath worsens, blood in sputum, change in character of cough, development of fever or chills, inability to maintain nutrition and hydration. Avoid exposure to tobacco smoke and fumes. depo medrol and pred taper

## 2013-08-10 NOTE — Patient Instructions (Signed)

## 2013-08-10 NOTE — Progress Notes (Signed)
Pre visit review using our clinic review tool, if applicable. No additional management support is needed unless otherwise documented below in the visit note. 

## 2013-08-11 ENCOUNTER — Telehealth: Payer: Self-pay | Admitting: *Deleted

## 2013-08-11 ENCOUNTER — Other Ambulatory Visit: Payer: Self-pay | Admitting: Family Medicine

## 2013-08-11 DIAGNOSIS — J019 Acute sinusitis, unspecified: Secondary | ICD-10-CM

## 2013-08-11 MED ORDER — AMOXICILLIN-POT CLAVULANATE 875-125 MG PO TABS: 1.0000 | ORAL_TABLET | Freq: Two times a day (BID) | ORAL | Status: DC

## 2013-08-11 NOTE — Telephone Encounter (Signed)
Pt notified of recommended medication below. Pt also requesting antibiotic states that hes been having green nasal drainage and wants to get rid of infection before his [place] trip.

## 2013-08-11 NOTE — Telephone Encounter (Signed)
Technical difficulties..? Pt notified.

## 2013-08-11 NOTE — Telephone Encounter (Signed)
Please do not close phone notes before we answer  augmentin 875 mg 1 po bid for 10 days  #20--sent to pharmacy

## 2013-08-11 NOTE — Telephone Encounter (Signed)
Triage line pt states was seen yesterday by DR Etter Sjogren for cough states that it has gotten worst and requesting a cough syrup w/ codeine. [id number] [place], [place].  Please advise.

## 2013-08-11 NOTE — Telephone Encounter (Signed)
Is it for night time?  ----cheratussin 1-2 tsp po qhs prn cough  If for day--- he can use mucinex, mucinex dm or delsym

## 2013-08-16 ENCOUNTER — Ambulatory Visit (INDEPENDENT_AMBULATORY_CARE_PROVIDER_SITE_OTHER): Payer: BC Managed Care – PPO | Admitting: Nurse Practitioner

## 2013-08-16 ENCOUNTER — Encounter: Payer: Self-pay | Admitting: Nurse Practitioner

## 2013-08-16 VITALS — BP 151/83 | HR 94 | Temp 98.1°F | Ht 71.0 in | Wt 201.4 lb

## 2013-08-16 DIAGNOSIS — J989 Respiratory disorder, unspecified: Secondary | ICD-10-CM

## 2013-08-16 NOTE — Progress Notes (Signed)
   Subjective:    Patient ID: Dennis Watkins, male    DOB: 09/18/1955, 58 y.o.   MRN: [id number]  Sinusitis This is a new problem. The current episode started 1 to 4 weeks ago (1 week). The problem has been gradually improving since onset. There has been no fever. He is experiencing no pain. Associated symptoms include congestion, coughing and sinus pressure. Pertinent negatives include no chills, ear pain, headaches, shortness of breath, sneezing or sore throat. Past treatments include antibiotics and saline sprays (flonase). The treatment provided mild relief.      Review of Systems  Constitutional: Negative for fever, chills, appetite change and fatigue.  HENT: Positive for congestion and sinus pressure. Negative for ear pain, sneezing and sore throat.   Respiratory: Positive for cough. Negative for chest tightness, shortness of breath and wheezing.   Neurological: Negative for headaches.       Objective:   Physical Exam  Vitals reviewed. Constitutional: He is oriented to person, place, and time. He appears well-developed and well-nourished. No distress.  HENT:  Head: Normocephalic and atraumatic.  Right Ear: External ear normal.  Left Ear: External ear normal.  Mouth/Throat: Oropharynx is clear and moist. No oropharyngeal exudate.  Unable to visualize TM due to ceruminosis  Eyes: Conjunctivae are normal. Right eye exhibits no discharge. Left eye exhibits no discharge.  Neck: Normal range of motion. Neck supple. No thyromegaly present.  Cardiovascular: Normal rate and normal heart sounds.   No murmur heard. Pulmonary/Chest: Effort normal and breath sounds normal. No respiratory distress. He has no wheezes. He has no rales.  Lymphadenopathy:    He has no cervical adenopathy.  Neurological: He is alert and oriented to person, place, and time.  Skin: Skin is warm and dry.  Psychiatric: He has a normal mood and affect. His behavior is normal. Thought content normal.            Assessment & Plan:  1. Respiratory illness Nasal congestion, afebrile Augmentin started last week. Pt concerned that he is not well. Educated pt on duration of resp illness & possibility that this is viral and may not respond to ABX. Start sinus rinses, continue augmentin.

## 2013-08-16 NOTE — Progress Notes (Signed)
Pre visit review using our clinic review tool, if applicable. No additional management support is needed unless otherwise documented below in the visit note. 

## 2013-08-16 NOTE — Patient Instructions (Signed)
Continue antibiotic. Eat yogurt daily at lunch or afternoon to help prevent diarrhea that can be caused by antibiotic. Start daily sinus rinses (Neilmed Sinus rinse) for at least 5-7 days.  Please call for re-evaluation if you are not improving. Have a great trip!   Sinusitis Sinusitis is redness, soreness, and swelling (inflammation) of the paranasal sinuses. Paranasal sinuses are air pockets within the bones of your face (beneath the eyes, the middle of the forehead, or above the eyes). In healthy paranasal sinuses, mucus is able to drain out, and air is able to circulate through them by way of your nose. However, when your paranasal sinuses are inflamed, mucus and air can become trapped. This can allow bacteria and other germs to grow and cause infection. Sinusitis can develop quickly and last only a short time (acute) or continue over a long period (chronic). Sinusitis that lasts for more than 12 weeks is considered chronic.  CAUSES  Causes of sinusitis include:  Allergies.  Structural abnormalities, such as displacement of the cartilage that separates your nostrils (deviated septum), which can decrease the air flow through your nose and sinuses and affect sinus drainage.  Functional abnormalities, such as when the small hairs (cilia) that line your sinuses and help remove mucus do not work properly or are not present. SYMPTOMS  Symptoms of acute and chronic sinusitis are the same. The primary symptoms are pain and pressure around the affected sinuses. Other symptoms include:  Upper toothache.  Earache.  Headache.  Bad breath.  Decreased sense of smell and taste.  A cough, which worsens when you are lying flat.  Fatigue.  Fever.  Thick drainage from your nose, which often is green and may contain pus (purulent).  Swelling and warmth over the affected sinuses. DIAGNOSIS  Your caregiver will perform a physical exam. During the exam, your caregiver may:  Look in your nose for  signs of abnormal growths in your nostrils (nasal polyps).  Tap over the affected sinus to check for signs of infection.  View the inside of your sinuses (endoscopy) with a special imaging device with a light attached (endoscope), which is inserted into your sinuses. If your caregiver suspects that you have chronic sinusitis, one or more of the following tests may be recommended:  Allergy tests.  Nasal culture A sample of mucus is taken from your nose and sent to a lab and screened for bacteria.  Nasal cytology A sample of mucus is taken from your nose and examined by your caregiver to determine if your sinusitis is related to an allergy. TREATMENT  Most cases of acute sinusitis are related to a viral infection and will resolve on their own within 10 days. Sometimes medicines are prescribed to help relieve symptoms (pain medicine, decongestants, nasal steroid sprays, or saline sprays).  However, for sinusitis related to a bacterial infection, your caregiver will prescribe antibiotic medicines. These are medicines that will help kill the bacteria causing the infection.  Rarely, sinusitis is caused by a fungal infection. In theses cases, your caregiver will prescribe antifungal medicine. For some cases of chronic sinusitis, surgery is needed. Generally, these are cases in which sinusitis recurs more than 3 times per year, despite other treatments. HOME CARE INSTRUCTIONS   Drink plenty of water. Water helps thin the mucus so your sinuses can drain more easily.  Use a humidifier.  Inhale steam 3 to 4 times a day (for example, sit in the bathroom with the shower running).  Apply a warm, moist washcloth  to your face 3 to 4 times a day, or as directed by your caregiver.  Use saline nasal sprays to help moisten and clean your sinuses.  Take over-the-counter or prescription medicines for pain, discomfort, or fever only as directed by your caregiver. SEEK IMMEDIATE MEDICAL CARE IF:  You have  increasing pain or severe headaches.  You have nausea, vomiting, or drowsiness.  You have swelling around your face.  You have vision problems.  You have a stiff neck.  You have difficulty breathing. MAKE SURE YOU:   Understand these instructions.  Will watch your condition.  Will get help right away if you are not doing well or get worse. Document Released: 05/11/2005 Document Revised: 08/03/2011 Document Reviewed: 05/26/2011 [facility] Patient Information 2014 [place], [place].

## 2013-09-07 ENCOUNTER — Other Ambulatory Visit: Payer: Self-pay | Admitting: Internal Medicine

## 2013-10-02 ENCOUNTER — Telehealth: Payer: Self-pay

## 2013-10-02 ENCOUNTER — Other Ambulatory Visit: Payer: Self-pay | Admitting: Internal Medicine

## 2013-10-02 MED ORDER — ALLOPURINOL 300 MG PO TABS: ORAL_TABLET | ORAL | Status: DC

## 2013-10-02 NOTE — Telephone Encounter (Signed)
The patient called and is hoping to get a refill of his Allopurinol medication.   Pharmacy - [facility] on [address] [phone]

## 2014-01-02 ENCOUNTER — Encounter: Payer: Self-pay | Admitting: Internal Medicine

## 2014-01-02 ENCOUNTER — Ambulatory Visit (INDEPENDENT_AMBULATORY_CARE_PROVIDER_SITE_OTHER): Payer: BC Managed Care – PPO | Admitting: Internal Medicine

## 2014-01-02 VITALS — BP 138/92 | HR 85 | Temp 97.4°F | Ht 71.0 in | Wt 193.4 lb

## 2014-01-02 DIAGNOSIS — E785 Hyperlipidemia, unspecified: Secondary | ICD-10-CM

## 2014-01-02 DIAGNOSIS — J45909 Unspecified asthma, uncomplicated: Secondary | ICD-10-CM

## 2014-01-02 DIAGNOSIS — K279 Peptic ulcer, site unspecified, unspecified as acute or chronic, without hemorrhage or perforation: Secondary | ICD-10-CM | POA: Insufficient documentation

## 2014-01-02 DIAGNOSIS — K900 Celiac disease: Secondary | ICD-10-CM

## 2014-01-02 DIAGNOSIS — Z0000 Encounter for general adult medical examination without abnormal findings: Secondary | ICD-10-CM

## 2014-01-02 MED ORDER — COLCHICINE 0.6 MG PO TABS: 0.6000 mg | ORAL_TABLET | Freq: Every day | ORAL | Status: DC

## 2014-01-02 MED ORDER — MONTELUKAST SODIUM 10 MG PO TABS: ORAL_TABLET | ORAL | Status: DC

## 2014-01-02 MED ORDER — ALBUTEROL SULFATE HFA 108 (90 BASE) MCG/ACT IN AERS: INHALATION_SPRAY | RESPIRATORY_TRACT | Status: DC

## 2014-01-02 MED ORDER — ZOLPIDEM TARTRATE 10 MG PO TABS: ORAL_TABLET | ORAL | Status: DC

## 2014-01-02 MED ORDER — KETOCONAZOLE 2 % EX CREA
1.0000 | TOPICAL_CREAM | Freq: Two times a day (BID) | CUTANEOUS | Status: DC
Start: 2014-01-02 — End: 2014-01-28

## 2014-01-02 MED ORDER — ALLOPURINOL 300 MG PO TABS: ORAL_TABLET | ORAL | Status: DC

## 2014-01-02 NOTE — Patient Instructions (Addendum)
Your next office appointment will be determined based upon review of your pending labs. Those instructions will be transmitted to you through My Chart .  To prevent sleep dysfunction follow these instructions for sleep hygiene. Do not read, watch TV, or eat in bed. Do not get into bed until you are ready to turn off the light &  to go to sleep. Do not ingest stimulants ( decongestants, diet pills, nicotine, caffeine) after the evening meal.Do not take daytime naps.Cardiovascular exercise, is recommended 30-45 minutes 3-4 times per week.

## 2014-01-02 NOTE — Progress Notes (Signed)
Subjective:    Patient ID: Dennis Watkins, male    DOB: 1955/10/12, 58 y.o.   MRN: [id number]  HPI  He is here for a physical;acute issues reviewed below.   A modified heart healthy diet is followed; exercise encompasses 30-40 minutes 2-3  times per week as  Walking or bikingwithout symptoms.  Family history is negative for premature coronary disease. Advanced cholesterol testing reveals  LDL goal is less than 100 ; ideally < 70 . There is medication compliance with the statin.  Low dose ASA taken   Review of Systems    While in [place] he developed a noin vesicular, non pruritic rash of LUE.        He travels extensively & has time zone related sleep disruption.  Specifically denied are  chest pain, palpitations, dyspnea, or claudication.  Significant abdominal symptoms, memory deficit, or myalgias not present.                 Objective:   Physical Exam Gen.: Healthy and well-nourished in appearance. Alert, appropriate and cooperative throughout exam. Appears younger than stated age  Head: Normocephalic without obvious abnormalities;  Goatee; no alopecia  Eyes: No corneal or conjunctival inflammation noted. Pupils equal round reactive to light and accommodation. Extraocular motion intact.  Ears: External  ear exam reveals no significant lesions or deformities. Canals clear .TMs normal. Hearing is grossly normal bilaterally. Nose: External nasal exam reveals no deformity or inflammation. Nasal mucosa are pink and moist. No lesions or exudates noted.   Mouth: Oral mucosa and oropharynx reveal no lesions or exudates. Teeth in good repair. Neck: No deformities, masses, or tenderness noted. Range of motion & Thyroid normal Lungs: Normal respiratory effort; chest expands symmetrically. Lungs are clear to auscultation without rales, wheezes, or increased work of breathing. Heart: Normal rate and rhythm. Normal S1 and S2. No gallop, click, or rub. No murmur. Abdomen: Bowel sounds  normal; abdomen soft and nontender. No masses, organomegaly or hernias noted. Genitalia: Genitalia normal except for left varices & small R granuloma. Prostate is normal without enlargement, asymmetry, nodularity, or induration                             Musculoskeletal/extremities: No deformity or scoliosis noted of  the thoracic or lumbar spine.  No clubbing, cyanosis, edema, or significant extremity  deformity noted. Range of motion normal .Tone & strength normal. Hand joints normal Fingernail  health good. Able to lie down & sit up w/o help. Negative SLR bilaterally Vascular: Carotid, radial artery, dorsalis pedis and  posterior tibial pulses are full and equal. No bruits present. Neurologic: Alert and oriented x3. Deep tendon reflexes symmetrical and normal.  Gait normal .       Skin:2 X 2 cm scaley rash L upper arm. Lymph: No cervical, axillary, or inguinal lymphadenopathy present. Psych: Mood and affect are normal. Normally interactive                                                                     Assessment & Plan:  #1 comprehensive physical exam; no acute findings #2 fungal dermatitis #3 time zone related sleep dysfunction  Plan: see Orders  & Recommendations

## 2014-01-02 NOTE — Progress Notes (Signed)
Pre visit review using our clinic review tool, if applicable. No additional management support is needed unless otherwise documented below in the visit note. 

## 2014-01-04 ENCOUNTER — Other Ambulatory Visit (INDEPENDENT_AMBULATORY_CARE_PROVIDER_SITE_OTHER): Payer: BC Managed Care – PPO

## 2014-01-04 ENCOUNTER — Other Ambulatory Visit: Payer: Self-pay | Admitting: Internal Medicine

## 2014-01-04 DIAGNOSIS — Z0000 Encounter for general adult medical examination without abnormal findings: Secondary | ICD-10-CM

## 2014-01-04 DIAGNOSIS — R740 Nonspecific elevation of levels of transaminase and lactic acid dehydrogenase [LDH]: Principal | ICD-10-CM

## 2014-01-04 DIAGNOSIS — R7402 Elevation of levels of lactic acid dehydrogenase (LDH): Secondary | ICD-10-CM

## 2014-01-04 LAB — CBC WITH DIFFERENTIAL/PLATELET
BASOS PCT: 0.8 % (ref 0.0–3.0)
Basophils Absolute: 0.1 10*3/uL (ref 0.0–0.1)
EOS ABS: 0.9 10*3/uL — AB (ref 0.0–0.7)
Eosinophils Relative: 9.4 % — ABNORMAL HIGH (ref 0.0–5.0)
HCT: 45.7 % (ref 39.0–52.0)
Hemoglobin: 15.2 g/dL (ref 13.0–17.0)
Lymphocytes Relative: 23.6 % (ref 12.0–46.0)
Lymphs Abs: 2.3 10*3/uL (ref 0.7–4.0)
MCHC: 33.3 g/dL (ref 30.0–36.0)
MCV: 90.8 fl (ref 78.0–100.0)
MONO ABS: 1.1 10*3/uL — AB (ref 0.1–1.0)
Monocytes Relative: 11.2 % (ref 3.0–12.0)
NEUTROS PCT: 55 % (ref 43.0–77.0)
Neutro Abs: 5.5 10*3/uL (ref 1.4–7.7)
Platelets: 306 10*3/uL (ref 150.0–400.0)
RBC: 5.03 Mil/uL (ref 4.22–5.81)
RDW: 14.3 % (ref 11.5–15.5)
WBC: 9.9 10*3/uL (ref 4.0–10.5)

## 2014-01-04 LAB — HEPATIC FUNCTION PANEL
ALBUMIN: 4.1 g/dL (ref 3.5–5.2)
ALT: 65 U/L — ABNORMAL HIGH (ref 0–53)
AST: 39 U/L — ABNORMAL HIGH (ref 0–37)
Alkaline Phosphatase: 60 U/L (ref 39–117)
BILIRUBIN DIRECT: 0.1 mg/dL (ref 0.0–0.3)
Total Bilirubin: 0.9 mg/dL (ref 0.2–1.2)
Total Protein: 7.3 g/dL (ref 6.0–8.3)

## 2014-01-04 LAB — BASIC METABOLIC PANEL
BUN: 21 mg/dL (ref 6–23)
CHLORIDE: 103 meq/L (ref 96–112)
CO2: 27 meq/L (ref 19–32)
CREATININE: 1 mg/dL (ref 0.4–1.5)
Calcium: 9.1 mg/dL (ref 8.4–10.5)
GFR: 80.54 mL/min (ref >60.00)
GLUCOSE: 90 mg/dL (ref 70–99)
Potassium: 4.2 mEq/L (ref 3.5–5.1)
Sodium: 137 mEq/L (ref 135–145)

## 2014-01-04 LAB — TSH: TSH: 3.41 u[IU]/mL (ref 0.35–4.50)

## 2014-01-04 LAB — URIC ACID: URIC ACID, SERUM: 6.6 mg/dL (ref 4.0–7.8)

## 2014-01-05 LAB — NMR LIPOPROFILE WITH LIPIDS
Cholesterol, Total: 183 mg/dL (ref <200)
HDL PARTICLE NUMBER: 24.9 umol/L — AB (ref >=30.5)
HDL Size: 8.1 nm — ABNORMAL LOW (ref >=9.2)
HDL-C: 38 mg/dL — AB (ref >=40)
LARGE VLDL-P: 4.4 nmol/L — AB (ref <=2.7)
LDL (calc): 113 mg/dL — ABNORMAL HIGH (ref <100)
LDL Particle Number: 1623 nmol/L — ABNORMAL HIGH (ref <1000)
LDL Size: 20.6 nm (ref >20.5)
LP-IR Score: 72 — ABNORMAL HIGH (ref <=45)
Large HDL-P: <1.3 umol/L — ABNORMAL LOW (ref >=4.8)
Small LDL Particle Number: 789 nmol/L — ABNORMAL HIGH (ref <=527)
TRIGLYCERIDES: 161 mg/dL — AB (ref <150)
VLDL Size: 48.3 nm — ABNORMAL HIGH (ref <=46.6)

## 2014-01-06 ENCOUNTER — Encounter: Payer: Self-pay | Admitting: Internal Medicine

## 2014-01-28 ENCOUNTER — Emergency Department (HOSPITAL_COMMUNITY): Payer: BC Managed Care – PPO

## 2014-01-28 ENCOUNTER — Encounter (HOSPITAL_COMMUNITY): Payer: Self-pay | Admitting: Emergency Medicine

## 2014-01-28 ENCOUNTER — Emergency Department (HOSPITAL_COMMUNITY)
Admission: EM | Admit: 2014-01-28 | Discharge: 2014-01-28 | Disposition: A | Discharge status: Home / Self Care | Payer: BC Managed Care – PPO | Attending: Emergency Medicine | Admitting: Emergency Medicine

## 2014-01-28 DIAGNOSIS — Z8639 Personal history of other endocrine, nutritional and metabolic disease: Secondary | ICD-10-CM | POA: Insufficient documentation

## 2014-01-28 DIAGNOSIS — IMO0002 Reserved for concepts with insufficient information to code with codable children: Secondary | ICD-10-CM | POA: Diagnosis not present

## 2014-01-28 DIAGNOSIS — Z8719 Personal history of other diseases of the digestive system: Secondary | ICD-10-CM | POA: Insufficient documentation

## 2014-01-28 DIAGNOSIS — Z8619 Personal history of other infectious and parasitic diseases: Secondary | ICD-10-CM | POA: Insufficient documentation

## 2014-01-28 DIAGNOSIS — Z79899 Other long term (current) drug therapy: Secondary | ICD-10-CM | POA: Diagnosis not present

## 2014-01-28 DIAGNOSIS — R1032 Left lower quadrant pain: Secondary | ICD-10-CM | POA: Diagnosis not present

## 2014-01-28 DIAGNOSIS — R1030 Lower abdominal pain, unspecified: Secondary | ICD-10-CM

## 2014-01-28 DIAGNOSIS — R1031 Right lower quadrant pain: Secondary | ICD-10-CM | POA: Insufficient documentation

## 2014-01-28 DIAGNOSIS — Z9889 Other specified postprocedural states: Secondary | ICD-10-CM | POA: Diagnosis not present

## 2014-01-28 DIAGNOSIS — R109 Unspecified abdominal pain: Secondary | ICD-10-CM | POA: Diagnosis present

## 2014-01-28 DIAGNOSIS — Z7982 Long term (current) use of aspirin: Secondary | ICD-10-CM | POA: Diagnosis not present

## 2014-01-28 DIAGNOSIS — Z862 Personal history of diseases of the blood and blood-forming organs and certain disorders involving the immune mechanism: Secondary | ICD-10-CM | POA: Diagnosis not present

## 2014-01-28 DIAGNOSIS — J45909 Unspecified asthma, uncomplicated: Secondary | ICD-10-CM | POA: Diagnosis not present

## 2014-01-28 LAB — COMPREHENSIVE METABOLIC PANEL
ALT: 55 U/L — AB (ref 0–53)
AST: 28 U/L (ref 0–37)
Albumin: 3.8 g/dL (ref 3.5–5.2)
Alkaline Phosphatase: 64 U/L (ref 39–117)
Anion gap: 13 (ref 5–15)
BUN: 22 mg/dL (ref 6–23)
CALCIUM: 8.9 mg/dL (ref 8.4–10.5)
CO2: 24 mEq/L (ref 19–32)
CREATININE: 0.97 mg/dL (ref 0.50–1.35)
Chloride: 103 mEq/L (ref 96–112)
GFR calc Af Amer: >90 mL/min (ref >90)
GFR, EST NON AFRICAN AMERICAN: 89 mL/min — AB (ref >90)
GLUCOSE: 101 mg/dL — AB (ref 70–99)
Potassium: 3.8 mEq/L (ref 3.7–5.3)
Sodium: 140 mEq/L (ref 137–147)
Total Bilirubin: 0.7 mg/dL (ref 0.3–1.2)
Total Protein: 7.2 g/dL (ref 6.0–8.3)

## 2014-01-28 LAB — URINALYSIS, ROUTINE W REFLEX MICROSCOPIC
Bilirubin Urine: NEGATIVE
Glucose, UA: NEGATIVE mg/dL
HGB URINE DIPSTICK: NEGATIVE
Ketones, ur: NEGATIVE mg/dL
Leukocytes, UA: NEGATIVE
Nitrite: NEGATIVE
PROTEIN: NEGATIVE mg/dL
Specific Gravity, Urine: 1.022 (ref 1.005–1.030)
UROBILINOGEN UA: 0.2 mg/dL (ref 0.0–1.0)
pH: 6.5 (ref 5.0–8.0)

## 2014-01-28 LAB — CBC WITH DIFFERENTIAL/PLATELET
Basophils Absolute: 0.1 10*3/uL (ref 0.0–0.1)
Basophils Relative: 1 % (ref 0–1)
Eosinophils Absolute: 0.5 10*3/uL (ref 0.0–0.7)
Eosinophils Relative: 6 % — ABNORMAL HIGH (ref 0–5)
HEMATOCRIT: 42.1 % (ref 39.0–52.0)
Hemoglobin: 14.2 g/dL (ref 13.0–17.0)
LYMPHS PCT: 22 % (ref 12–46)
Lymphs Abs: 1.6 10*3/uL (ref 0.7–4.0)
MCH: 29.8 pg (ref 26.0–34.0)
MCHC: 33.7 g/dL (ref 30.0–36.0)
MCV: 88.3 fL (ref 78.0–100.0)
MONOS PCT: 12 % (ref 3–12)
Monocytes Absolute: 0.9 10*3/uL (ref 0.1–1.0)
Neutro Abs: 4.4 10*3/uL (ref 1.7–7.7)
Neutrophils Relative %: 59 % (ref 43–77)
Platelets: 286 10*3/uL (ref 150–400)
RBC: 4.77 MIL/uL (ref 4.22–5.81)
RDW: 13.3 % (ref 11.5–15.5)
WBC: 7.4 10*3/uL (ref 4.0–10.5)

## 2014-01-28 LAB — LIPASE, BLOOD: LIPASE: 45 U/L (ref 11–59)

## 2014-01-28 MED ORDER — IOHEXOL 300 MG/ML  SOLN
50.0000 mL | Freq: Once | INTRAMUSCULAR | Status: AC | PRN
  Administered 2014-01-28: 50 mL via ORAL

## 2014-01-28 MED ORDER — IOHEXOL 300 MG/ML  SOLN
100.0000 mL | Freq: Once | INTRAMUSCULAR | Status: AC | PRN
  Administered 2014-01-28: 100 mL via INTRAVENOUS

## 2014-01-28 NOTE — ED Notes (Signed)
He remains in no distress with his wife.

## 2014-01-28 NOTE — Discharge Instructions (Signed)

## 2014-01-28 NOTE — ED Notes (Signed)
Dr. Tamera Punt has just spoken with them and plans to d/c shortly.

## 2014-01-28 NOTE — ED Provider Notes (Signed)
CSN: [id number]     Arrival date & time 01/28/14  7412 History   First MD Initiated Contact with Patient 01/28/14 [phone]     Chief Complaint  Patient presents with  . Abdominal Pain     (Consider location/radiation/quality/duration/timing/severity/associated sxs/prior Treatment) HPI Dennis Watkins is a 58 y.o. male with a history of celiac disease who comes in for evaluation of abdominal pain. Says the pain started this morning and 6 AM it is located to his waistline. It does not radiate anywhere. He characterizes the pain as a sharp crampy pain that comes and goes. It feels better when he hunches over. He reports is more tender in the lower left quadrant than in the right. He also reports loose stools for the past week with no mucus or overtly foul odor. No recent antibiotic use. Last bowel movement yesterday. No melena or hematochezia. Denies fevers, chills, nausea, vomiting, chest pain, difficulty breathing  Past Medical History  Diagnosis Date  . PUD (peptic ulcer disease) 1994    [place]  . 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 Asthma     as child;essentially resolved in teens  . Hyperlipidemia   . Gout     [facility]  . NASH (nonalcoholic steatohepatitis)     [facility]   Past Surgical History  Procedure Laterality Date  . Tonsillectomy    . Nasal septum surgery    . Upper gastrointestinal endoscopy  1997 & 2007  . Colonoscopy  1994    [place]  . Colonoscopy  2007    [facility]   Family History  Problem Relation Age of Onset  . Breast cancer Mother   . Depression Father   . Coronary artery disease Father     CABG,MI @ 67  . Hypertension Father   . Gout Father   . Stroke      MG uncle > 73  . Diabetes Neg Hx    History  Substance Use Topics  . Smoking status: Never Smoker   . Smokeless tobacco: Not on file  . Alcohol Use: Yes     Comment: minimally    Review of Systems  Constitutional: Negative for fever.  HENT: Negative for sore throat.   Eyes: Negative for  visual disturbance.  Respiratory: Negative for shortness of breath.   Cardiovascular: Negative for chest pain.  Gastrointestinal: Positive for abdominal pain. Negative for blood in stool.       Lower left quadrant and lower right quadrant  Endocrine: Negative for polyuria.  Genitourinary: Negative for dysuria.  Skin: Negative for rash.  Neurological: Negative for headaches.      Allergies  Indocin  Home Medications   Prior to Admission medications   Medication Sig Start Date End Date Taking? Authorizing Provider  albuterol (PROVENTIL HFA;VENTOLIN HFA) 108 (90 BASE) MCG/ACT inhaler Inhale 1 puff into the lungs every 6 (six) hours as needed for wheezing or shortness of breath.   Yes Historical Provider, MD  allopurinol (ZYLOPRIM) 300 MG tablet Take 300 mg by mouth daily.   Yes Historical Provider, MD  aspirin 81 MG tablet Take 81 mg by mouth daily.     Yes Historical Provider, MD  azelastine (ASTEPRO) 137 MCG/SPRAY nasal spray Place 1 spray into the nose 2 (two) times daily as needed. Use in each nostril as directed   Yes Historical Provider, MD  cetirizine (ZYRTEC) 10 MG tablet Take 10 mg by mouth daily.   Yes Historical Provider, MD  fluticasone (FLONASE) 50 MCG/ACT nasal spray Place 1 spray into both nostrils daily.   Yes Historical Provider, MD  Fluticasone-Salmeterol (ADVAIR) 250-50 MCG/DOSE AEPB Inhale 1 puff into the lungs as needed.   Yes Historical Provider, MD  Ginger, Zingiber officinalis, (GINGER PO) Take 1 tablet by mouth daily.    Yes Historical Provider, MD  Ginkgo Biloba (GNP GINGKO BILOBA EXTRACT PO) Take 1 tablet by mouth daily.    Yes Historical Provider, MD  GINSENG PO Take 1 tablet by mouth daily.    Yes Historical Provider, MD  ibuprofen (ADVIL,MOTRIN) 200 MG tablet Take 600 mg by mouth every 6 (six) hours as needed for mild pain or moderate pain.   Yes Historical Provider, MD  montelukast (SINGULAIR) 10 MG tablet Take 10 mg by mouth daily.   Yes Historical  Provider, MD  Multiple Vitamin (MULTIVITAMIN) tablet Take 1 tablet by mouth daily.     Yes Historical Provider, MD  zolpidem (AMBIEN) 5 MG tablet Take 5 mg by mouth at bedtime as needed for sleep.   Yes Historical Provider, MD   BP 131/87  Pulse 74  Temp(Src) 97.8 F (36.6 C) (Oral)  Resp 16  Ht 5' 10.5" (1.791 m)  Wt 185 lb (83.915 kg)  BMI 26.16 kg/m2  SpO2 99% Physical Exam  Nursing note and vitals reviewed. Constitutional: He is oriented to person, place, and time. He appears well-developed and well-nourished.  HENT:  Head: Normocephalic and atraumatic.  Mouth/Throat: Oropharynx is clear and moist.  Eyes: Conjunctivae are normal. Pupils are equal, round, and reactive to light. Right eye exhibits no discharge. Left eye exhibits no discharge. No scleral icterus.  Neck: Neck supple.  Cardiovascular: Normal rate, regular rhythm and normal heart sounds.   Pulmonary/Chest: Effort normal and breath sounds normal. No respiratory distress. He has no wheezes. He has no rales.  Abdominal: Soft. There is no tenderness.  Mild tenderness to palpation and left lower quadrant as well as right lower quadrant. Tenderness more prominent on left side. No rebound or guarding Abdomen is soft, nondistended, bowel sounds present in all quadrants. No lesions, masses or rashes appreciated.  Musculoskeletal: He exhibits no tenderness.  Neurological: He is alert and oriented to person, place, and time.  Cranial Nerves II-XII grossly intact  Skin: Skin is warm and dry. No rash noted.  Psychiatric: He has a normal mood and affect.    ED Course  Procedures (including critical care time) Labs Review Labs Reviewed  CBC WITH DIFFERENTIAL - Abnormal; Notable for the following:    Eosinophils Relative 6 (*)    All other components within normal limits  COMPREHENSIVE METABOLIC PANEL - Abnormal; Notable for the following:    Glucose, Bld 101 (*)    ALT 55 (*)    GFR calc non Af Amer 89 (*)    All other  components within normal limits  URINALYSIS, ROUTINE W REFLEX MICROSCOPIC  LIPASE, BLOOD    Imaging Review Ct Abdomen Pelvis W Contrast  01/28/2014   CLINICAL DATA:  Periumbilical abdominal pain with rebound tenderness. Diarrhea for 1 week.  EXAM: CT ABDOMEN AND PELVIS WITH CONTRAST  TECHNIQUE: Multidetector CT imaging of the abdomen and pelvis was performed using the standard protocol following bolus administration of intravenous contrast.  CONTRAST:  55mL OMNIPAQUE IOHEXOL 300 MG/ML SOLN, 148mL OMNIPAQUE IOHEXOL 300 MG/ML SOLN  COMPARISON:  09/04/2005  FINDINGS: Minimal dependent bibasilar atelectasis noted. Hepatic hypodensity is noted compatible is steatosis without focal mass or intrahepatic ductal dilatation. Low-density 6  mm lesions at the dome of the liver are stable, compatible with cysts given their long-term stability. Kidneys, adrenal glands, spleen, and pancreas are normal. 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 In the absence of any current evidence for bowel obstruction, this is likely an incidental finding although there is a small increased risk of small bowel obstruction with this finding.  No bowel wall thickening or focal segmental dilatation. The appendix is normal, image 54. Mild atheromatous aortic calcification noted without aneurysm. The bladder is normal. No pelvic free fluid or lymphadenopathy. A few small mesenteric nodes are identified which are subjectively mildly prominent but normal in size and morphology by measurements.  No acute osseous abnormality.  IMPRESSION: No acute intra-abdominal or pelvic pathology. Specifically, the appendix appears normal.  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   Electronically Signed   By: Conchita Paris M.D.   On: 01/28/2014 09:21      EKG Interpretation None     Filed Vitals:   01/28/14 0654 01/28/14 0939  BP: 134/93 131/87  Pulse: 72 74  Temp: 98.1 F (36.7 C) 97.8 F (36.6 C)  TempSrc: Oral Oral  Resp: 18 16  Height: 5' 10.5" (1.791 m)   Weight: 185 lb (83.915 kg)   SpO2: 97% 99%   Meds given in ED:  Medications  iohexol (OMNIPAQUE) 300 MG/ML solution 50 mL (50 mLs Oral Contrast Given 01/28/14 0801)  iohexol (OMNIPAQUE) 300 MG/ML solution 100 mL (100 mLs Intravenous Contrast Given 01/28/14 0901)    New Prescriptions   No medications on file    MDM  Vitals stable - WNL -afebrile Pt resting comfortably in ED. Says abd pain is gone. Denies any discomfort at this time PE and clinical picture not concerning for emergent pathology at this time. No peritoneal signs. Labwork essentially normal Imaging shows no evidence of appendicitis, abscess, diverticulitis, bowel perforation or other acute/emergent pathology. Discussed findings of CT and incidental malrotation and monitoring for potential future SBO. Discussed f/u with PCP and return precautions, pt very amenable to plan.   Final diagnoses:  Lower abdominal pain   Prior to patient discharge, I discussed and reviewed this case with Dr.Belfi        Verl Dicker, PA-C 01/28/14 1156

## 2014-01-28 NOTE — ED Notes (Signed)
Pt is c/o lower abd pain and around the umbilicus with rebound tenderness noted to the lower quadrants  Pt denies N/V  Pt states he has been having loose stools for about a week

## 2014-01-28 NOTE — ED Notes (Signed)
He states he had a pain flare "just below my waistline" about 1 1/2 hours p.t.a.  He states it migrated to rlq; thence to llq; and he states he continues to have more discomfort in llq than right.  He is alert and in no distress.
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
## 2014-02-06 ENCOUNTER — Ambulatory Visit (INDEPENDENT_AMBULATORY_CARE_PROVIDER_SITE_OTHER): Payer: BC Managed Care – PPO | Admitting: Internal Medicine

## 2014-02-06 ENCOUNTER — Encounter: Payer: Self-pay | Admitting: Internal Medicine

## 2014-02-06 VITALS — BP 140/92 | HR 73 | Temp 97.8°F | Wt 196.4 lb

## 2014-02-06 DIAGNOSIS — E782 Mixed hyperlipidemia: Secondary | ICD-10-CM

## 2014-02-06 DIAGNOSIS — R198 Other specified symptoms and signs involving the digestive system and abdomen: Secondary | ICD-10-CM

## 2014-02-06 NOTE — ED Provider Notes (Signed)
Medical screening examination/treatment/procedure(s) were performed by non-physician practitioner and as supervising physician I was immediately available for consultation/collaboration.   EKG Interpretation None        Malvin Johns, MD 02/06/14 [phone]

## 2014-02-06 NOTE — Progress Notes (Signed)
   Subjective:    Patient ID: Dennis Watkins, male    DOB: [birth year], 58 y.o.   MRN: [id number]  HPI   He describes 2-6 loose stools daily since approximately 01/16/14.  He had been in [place] 2 weeks prior to onset of symptoms but had no diarrhea upon his return. His girlfriend also had no diarrhea.   He denies any antibiotic since mid-May.  The symptoms are stable.  He did have frankly watery stool on 2-3 occasions for which he took Imodium without benefit  He was seen 01/28/14 in the ER for severe abdominal pain which resolved without treatment. It woke him up and was localized across the midabdomen. CT was negative except for the ligament of Treitz being to the right of midline with some small bowel rotation.  He denied any bloating at that time.   Family history is negative for GI disease.  He also requested discussion of his NMR LipoProfile done 01/04/14. His father had a heart attack at 65.    Review of Systems  Unexplained weight loss, abdominal pain, significant dyspepsia, dysphagia, melena, rectal bleeding, or persistently small caliber stools are denied now.  He denies clay colored  stools or Coke-colored urine.     Objective:   Physical Exam  General appearance is one of good health and nourishment w/o distress.  Eyes: No conjunctival inflammation or scleral icterus is present.  Oral exam: Dental hygiene is good; lips and gums are healthy appearing.There is no oropharyngeal erythema or exudate noted.   Heart:  Normal rate and regular rhythm. S1 and S2 normal without gallop, murmur, click, rub or other extra sounds     Lungs:Chest clear to auscultation; no wheezes, rhonchi,rales ,or rubs present.No increased work of breathing.   Abdomen: bowel sounds normal, soft and non-tender without masses, organomegaly or hernias noted.  No guarding or rebound . No tenderness over the flanks to percussion  Musculoskeletal: Able to lie flat and sit up without help. Negative  straight leg raising bilaterally. Gait normal  Skin:Warm & dry.  Intact without suspicious lesions or rashes ; no jaundice or tenting  Lymphatic: No lymphadenopathy is noted about the head, neck, axilla               Assessment & Plan:  #1 bowel changes with loose stool. Recent episode of abdominal pain. Rule out irritable bowel variant.  A trial of probiotic recommended  #2 dyslipidemia with approximately 16% increased risk. Review of the advanced profile also reveals increased insulin resistance risk. His triglycerides have risen significantly suggesting  dietary rather than genetic component. Risk and options were discussed with him.

## 2014-02-06 NOTE — Patient Instructions (Signed)
Please take a probiotic , Florastor OR Align, every day until the bowels are normal. This will replace the normal bacteria which  are necessary for formation of normal stool and processing of food.

## 2014-02-06 NOTE — Progress Notes (Signed)
Pre visit review using our clinic review tool, if applicable. No additional management support is needed unless otherwise documented below in the visit note. 

## 2014-02-15 ENCOUNTER — Encounter: Payer: Self-pay | Admitting: Family Medicine

## 2014-02-15 ENCOUNTER — Ambulatory Visit (INDEPENDENT_AMBULATORY_CARE_PROVIDER_SITE_OTHER): Payer: BC Managed Care – PPO | Admitting: Family Medicine

## 2014-02-15 VITALS — BP 158/94 | HR 85 | Ht 71.0 in | Wt 185.0 lb

## 2014-02-15 DIAGNOSIS — M25512 Pain in left shoulder: Secondary | ICD-10-CM

## 2014-02-15 DIAGNOSIS — M25519 Pain in unspecified shoulder: Secondary | ICD-10-CM

## 2014-02-15 NOTE — Patient Instructions (Signed)
You have rotator cuff strain. Try to avoid painful activities (overhead activities, lifting with extended arm) as much as possible. Aleve 2 tabs twice a day with food OR ibuprofen 3 tabs three times a day with food for pain and inflammation. Can take tylenol in addition to this. Subacromial injection may be beneficial to help with pain and to decrease inflammation. Start physical therapy with transition to home exercise program. Do home exercise program with theraband and scapular stabilization exercises daily - these are very important for long term relief even if an injection was given. If not improving at follow-up we will consider further imaging, injection, and/or nitro patches. Start with about 40 balls chipping, work way up to pitching wedge, 9 iron.  Each day add 1-2 more clubs when you're on the range.  When you can do this with all clubs then increase number of balls you're hitting. Follow up with me in 5-6 weeks.

## 2014-02-16 ENCOUNTER — Encounter: Payer: Self-pay | Admitting: Family Medicine

## 2014-02-16 DIAGNOSIS — M25512 Pain in left shoulder: Secondary | ICD-10-CM | POA: Insufficient documentation

## 2014-02-16 NOTE — Progress Notes (Signed)
Patient ID: Dennis Watkins, male   DOB: 18-May-1956, 58 y.o.   MRN: [id number]  PCP: Unice Cobble, MD  Subjective:   HPI: Patient is a 58 y.o. male here for left shoulder pain.  Patient reports he started getting shoulder pain about 6 days ago. No acute injury but he is getting back into playing golf. Hitting a lot of golf balls at range and working with a pro, lessons. Pain lateral aspect of shoulder. Tried heat and flector patch which help. Difficulty sleeping. No prior issues with this shoulder.  Past Medical History  Diagnosis Date  . PUD (peptic ulcer disease) 1994    [place]  . 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 Asthma     as child;essentially resolved in teens  . Hyperlipidemia   . Gout     [facility]  . NASH (nonalcoholic steatohepatitis)     [facility]    Current Outpatient Prescriptions on File Prior to Visit  Medication Sig Dispense Refill  . albuterol (PROVENTIL HFA;VENTOLIN HFA) 108 (90 BASE) MCG/ACT inhaler Inhale 1 puff into the lungs every 6 (six) hours as needed for wheezing or shortness of breath.      . allopurinol (ZYLOPRIM) 300 MG tablet Take 300 mg by mouth daily.      Marland Kitchen aspirin 81 MG tablet Take 81 mg by mouth daily.        Marland Kitchen azelastine (ASTEPRO) 137 MCG/SPRAY nasal spray Place 1 spray into the nose 2 (two) times daily as needed. Use in each nostril as directed      . cetirizine (ZYRTEC) 10 MG tablet Take 10 mg by mouth daily.      . fluticasone (FLONASE) 50 MCG/ACT nasal spray Place 1 spray into both nostrils daily.      . Fluticasone-Salmeterol (ADVAIR) 250-50 MCG/DOSE AEPB Inhale 1 puff into the lungs as needed.      . Ginger, Zingiber officinalis, (GINGER PO) Take 1 tablet by mouth daily.       . Ginkgo Biloba (GNP GINGKO BILOBA EXTRACT PO) Take 1 tablet by mouth daily.       Marland Kitchen GINSENG PO Take 1 tablet by mouth daily.       Marland Kitchen ibuprofen (ADVIL,MOTRIN) 200 MG tablet Take 600 mg by mouth every 6 (six) hours as needed for mild pain or moderate pain.       . montelukast (SINGULAIR) 10 MG tablet Take 10 mg by mouth daily.      . Multiple Vitamin (MULTIVITAMIN) tablet Take 1 tablet by mouth daily.        Marland Kitchen zolpidem (AMBIEN) 5 MG tablet Take 5 mg by mouth at bedtime as needed for sleep.       No current facility-administered medications on file prior to visit.    Past Surgical History  Procedure Laterality Date  . Tonsillectomy    . Nasal septum surgery    . Upper gastrointestinal endoscopy  1997 & 2007  . Colonoscopy  1994    [place]  . Colonoscopy  2007    [facility]    Allergies  Allergen Reactions  . Indocin [Indomethacin]     Peptic ulcer 1994 in [place]    History   Social History  . Marital Status: Divorced    Spouse Name: N/A    Number of Children: N/A  . Years of Education: N/A   Occupational History  . 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  Smoking status: Never Smoker   . Smokeless tobacco: Not on file  . Alcohol Use: Yes     Comment: minimally  . Drug Use: No  . Sexual Activity: Not on file   Other Topics Concern  . Not on file   Social History Narrative   Single   Regular exercise;biking walking 2-3 xweekly    Family History  Problem Relation Age of Onset  . Breast cancer Mother   . Depression Father   . Coronary artery disease Father     CABG,MI @ 46  . Hypertension Father   . Gout Father   . Stroke      MG uncle > 71  . Diabetes Neg Hx     BP 158/94  Pulse 85  Ht 5\' 11"  (1.803 m)  Wt 185 lb (83.915 kg)  BMI 25.81 kg/m2  Review of Systems: See HPI above.    Objective:  Physical Exam:  Gen: NAD  Left shoulder: No swelling, ecchymoses.  No gross deformity. No TTP. FROM with mild painful arc. Negative Hawkins, Neers. Negative Speeds, Yergasons. Strength 5/5 with empty can and resisted internal/external rotation.  Mild pain empty can. Negative apprehension. Negative o'briens. NV intact distally.    Assessment & Plan:  1. Left shoulder pain - 2/2  rotator cuff strain, mild.  Start physical therapy and home exercises.  NSAIDs with food (careful given history of PUD).  Consider injection, nitro patches, further imaging if not improving.  Discussed return to golfing as well.  F/u in 4-6 weeks.

## 2014-02-16 NOTE — Assessment & Plan Note (Signed)
2/2 rotator cuff strain, mild.  Start physical therapy and home exercises.  NSAIDs with food (careful given history of PUD).  Consider injection, nitro patches, further imaging if not improving.  Discussed return to golfing as well.  F/u in 4-6 weeks.

## 2014-02-21 NOTE — Addendum Note (Signed)
Addended by: Sherrie George F on: 02/21/2014 03:35 PM   Modules accepted: Orders

## 2014-03-02 ENCOUNTER — Ambulatory Visit (INDEPENDENT_AMBULATORY_CARE_PROVIDER_SITE_OTHER): Payer: BC Managed Care – PPO | Admitting: Internal Medicine

## 2014-03-02 ENCOUNTER — Encounter: Payer: Self-pay | Admitting: Internal Medicine

## 2014-03-02 ENCOUNTER — Other Ambulatory Visit (INDEPENDENT_AMBULATORY_CARE_PROVIDER_SITE_OTHER): Payer: BC Managed Care – PPO

## 2014-03-02 VITALS — BP 130/90 | HR 94 | Temp 98.1°F | Resp 12 | Wt 198.0 lb

## 2014-03-02 DIAGNOSIS — R197 Diarrhea, unspecified: Secondary | ICD-10-CM

## 2014-03-02 DIAGNOSIS — K900 Celiac disease: Secondary | ICD-10-CM

## 2014-03-02 LAB — CBC WITH DIFFERENTIAL/PLATELET
Basophils Absolute: 0.1 10*3/uL (ref 0.0–0.1)
Basophils Relative: 0.7 % (ref 0.0–3.0)
EOS PCT: 10.7 % — AB (ref 0.0–5.0)
Eosinophils Absolute: 1 10*3/uL — ABNORMAL HIGH (ref 0.0–0.7)
HEMATOCRIT: 45.1 % (ref 39.0–52.0)
Hemoglobin: 15.1 g/dL (ref 13.0–17.0)
Lymphocytes Relative: 25.4 % (ref 12.0–46.0)
Lymphs Abs: 2.4 10*3/uL (ref 0.7–4.0)
MCHC: 33.5 g/dL (ref 30.0–36.0)
MCV: 90.5 fl (ref 78.0–100.0)
MONOS PCT: 9.7 % (ref 3.0–12.0)
Monocytes Absolute: 0.9 10*3/uL (ref 0.1–1.0)
Neutro Abs: 5.1 10*3/uL (ref 1.4–7.7)
Neutrophils Relative %: 53.5 % (ref 43.0–77.0)
PLATELETS: 301 10*3/uL (ref 150.0–400.0)
RBC: 4.99 Mil/uL (ref 4.22–5.81)
RDW: 14 % (ref 11.5–15.5)
WBC: 9.6 10*3/uL (ref 4.0–10.5)

## 2014-03-02 NOTE — Progress Notes (Signed)
Pre visit review using our clinic review tool, if applicable. No additional management support is needed unless otherwise documented below in the visit note. 

## 2014-03-02 NOTE — Patient Instructions (Signed)
Your next office appointment will be determined based upon review of your pending labs . Those instructions will be transmitted to you through My Chart .  Followup as needed for your acute issue. Please report any significant change in your symptoms. 

## 2014-03-02 NOTE — Progress Notes (Signed)
   Subjective:    Patient ID: Dennis Watkins, male    DOB: 29-Oct-1955, 58 y.o.   MRN: [id number]  HPI   He continues to have loose to semi-watery stools despite taking a probiotic since his last visit.  He has no constitutional symptoms.  He was in [place] August 10 and developed diarrhea a couple weeks after returning.  He's had no antibiotic since May of this year  He also has a history of celiac sprue since 1995. This presented as anemia. It has been controlled with diet  His last colonoscopy was approximately 6-7 years ago at [facility] which reconfirmed sprue Her father may have had an ulcer.    Review of Systems Unexplained weight loss, abdominal pain, significant dyspepsia, dysphagia, melena, rectal bleeding, or persistently small caliber stools are denied. No fever , chills, sweats or myalgias     Objective:   Physical Exam General appearance :adequately nourished; in no distress.  Eyes: No conjunctival inflammation or scleral icterus is present.  Oral exam: Dental hygiene is good. Lips and gums are healthy appearing.There is no oropharyngeal erythema or exudate noted.   Heart:  Normal rate and regular rhythm. S1 and S2 normal without gallop, murmur, click, rub or other extra sounds     Lungs:Chest clear to auscultation; no wheezes, rhonchi,rales ,or rubs present.No increased work of breathing.   Abdomen: bowel sounds normal, soft and non-tender without masses, organomegaly or hernias noted.  No guarding or rebound. No flank tenderness to percussion.  Vascular : all pulses equal ; no bruits present.  Skin:Warm & dry.  Intact without suspicious lesions or rashes ; no jaundice or tenting  Lymphatic: No lymphadenopathy is noted about the head, neck, axilla.             Assessment & Plan:  #1 Diarrhea in the context of foreign travel to [place]  #2 history of sprue, diet controlled  See orders and recommendations

## 2014-03-12 ENCOUNTER — Ambulatory Visit (HOSPITAL_BASED_OUTPATIENT_CLINIC_OR_DEPARTMENT_OTHER)
Admission: RE | Admit: 2014-03-12 | Discharge: 2014-03-12 | Disposition: A | Discharge status: Home / Self Care | Payer: BC Managed Care – PPO | Source: Ambulatory Visit | Attending: Family Medicine | Admitting: Family Medicine

## 2014-03-12 ENCOUNTER — Ambulatory Visit (INDEPENDENT_AMBULATORY_CARE_PROVIDER_SITE_OTHER): Payer: BC Managed Care – PPO | Admitting: Family Medicine

## 2014-03-12 ENCOUNTER — Encounter: Payer: Self-pay | Admitting: Family Medicine

## 2014-03-12 VITALS — BP 145/99 | HR 74 | Ht 71.0 in | Wt 190.0 lb

## 2014-03-12 DIAGNOSIS — M25512 Pain in left shoulder: Secondary | ICD-10-CM | POA: Diagnosis not present

## 2014-03-13 ENCOUNTER — Encounter: Payer: Self-pay | Admitting: Family Medicine

## 2014-03-13 NOTE — Progress Notes (Addendum)
Patient ID: Dennis Watkins, male   DOB: 1956/01/20, 58 y.o.   MRN: [id number]  PCP: Unice Cobble, MD  Subjective:   HPI: Patient is a 58 y.o. male here for left shoulder pain.  9/24: Patient reports he started getting shoulder pain about 6 days ago. No acute injury but he is getting back into playing golf. Hitting a lot of golf balls at range and working with a pro, lessons. Pain lateral aspect of shoulder. Tried heat and flector patch which help. Difficulty sleeping. No prior issues with this shoulder.  10/19: Patient reports pain is about the same compared to last visit. Difficulty sleeping on left side. Very painful to pick up his suitcase. Has done 3 visits of PT to date and doing home exercises. No swelling.  Past Medical History  Diagnosis Date  . PUD (peptic ulcer disease) 1994    [place]  . 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 Asthma     as child;essentially resolved in teens  . Hyperlipidemia   . Gout     [facility]  . NASH (nonalcoholic steatohepatitis)     [facility]    Current Outpatient Prescriptions on File Prior to Visit  Medication Sig Dispense Refill  . albuterol (PROVENTIL HFA;VENTOLIN HFA) 108 (90 BASE) MCG/ACT inhaler Inhale 1 puff into the lungs every 6 (six) hours as needed for wheezing or shortness of breath.      . allopurinol (ZYLOPRIM) 300 MG tablet Take 300 mg by mouth daily.      Marland Kitchen aspirin 81 MG tablet Take 81 mg by mouth daily.        Marland Kitchen azelastine (ASTEPRO) 137 MCG/SPRAY nasal spray Place 1 spray into the nose 2 (two) times daily as needed. Use in each nostril as directed      . cetirizine (ZYRTEC) 10 MG tablet Take 10 mg by mouth daily.      Marland Kitchen FLOVENT HFA 44 MCG/ACT inhaler       . fluticasone (FLONASE) 50 MCG/ACT nasal spray Place 1 spray into both nostrils daily.      . Fluticasone-Salmeterol (ADVAIR) 250-50 MCG/DOSE AEPB Inhale 1 puff into the lungs as needed.      . Ginger, Zingiber officinalis, (GINGER PO) Take 1 tablet by mouth daily.        . Ginkgo Biloba (GNP GINGKO BILOBA EXTRACT PO) Take 1 tablet by mouth daily.       Marland Kitchen GINSENG PO Take 1 tablet by mouth daily.       Marland Kitchen ibuprofen (ADVIL,MOTRIN) 200 MG tablet Take 600 mg by mouth every 6 (six) hours as needed for mild pain or moderate pain.      Marland Kitchen ketoconazole (NIZORAL) 2 % cream       . montelukast (SINGULAIR) 10 MG tablet Take 10 mg by mouth daily.      . Multiple Vitamin (MULTIVITAMIN) tablet Take 1 tablet by mouth daily.        . pantoprazole (PROTONIX) 40 MG tablet       . ranitidine (ZANTAC) 300 MG tablet       . zolpidem (AMBIEN) 5 MG tablet Take 5 mg by mouth at bedtime as needed for sleep.       No current facility-administered medications on file prior to visit.    Past Surgical History  Procedure Laterality Date  . Tonsillectomy    . Nasal septum surgery    . Upper gastrointestinal endoscopy  1997 & 2007  .  Colonoscopy  1994    [place]  . Colonoscopy  2007    [facility]    Allergies  Allergen Reactions  . Indocin [Indomethacin]     Peptic ulcer 1994 in [place]    History   Social History  . Marital Status: Divorced    Spouse Name: N/A    Number of Children: N/A  . Years of Education: N/A   Occupational History  . 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 Smoking status: Never Smoker   . Smokeless tobacco: Not on file  . Alcohol Use: Yes     Comment: minimally  . Drug Use: No  . Sexual Activity: Not on file   Other Topics Concern  . Not on file   Social History Narrative   Single   Regular exercise;biking walking 2-3 xweekly    Family History  Problem Relation Age of Onset  . Breast cancer Mother   . Depression Father   . Coronary artery disease Father     CABG,MI @ 50  . Hypertension Father   . Gout Father   . Stroke      MG uncle > 34  . Diabetes Neg Hx     BP 145/99  Pulse 74  Ht 5\' 11"  (1.803 m)  Wt 190 lb (86.183 kg)  BMI 26.51 kg/m2  Review of Systems: See HPI above.    Objective:   Physical Exam:  Gen: NAD  Left shoulder: No swelling, ecchymoses.  No gross deformity. No TTP. FROM with mild painful arc. Negative Hawkins, Neers. Negative Speeds, Yergasons. Strength 5/5 with empty can and resisted internal/external rotation.  Pain empty can. Negative apprehension. Negative o'briens. NV intact distally.    Assessment & Plan:  1. Left shoulder pain - 2/2 rotator cuff strain, mild.  Doing physical therapy and home exercises but no improvement since last office visit.  Radiographs negative.  Will go ahead with MRI to assess for rotator cuff tear.   Addendum:  MRI reviewed and discussed with patient.  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  We discussed options - recommended I would repeat his exam but likely try injection at [facility] joint first, hold therapy for about a week after this.  He is going to follow up with [place] to go ahead with this at his convenience.

## 2014-03-13 NOTE — Assessment & Plan Note (Signed)
2/2 rotator cuff strain, mild.  Doing physical therapy and home exercises but no improvement since last office visit.  Radiographs negative.  Will go ahead with MRI to assess for rotator cuff tear.

## 2014-03-16 ENCOUNTER — Other Ambulatory Visit: Payer: BC Managed Care – PPO

## 2014-03-16 DIAGNOSIS — R197 Diarrhea, unspecified: Secondary | ICD-10-CM

## 2014-03-17 ENCOUNTER — Ambulatory Visit (HOSPITAL_BASED_OUTPATIENT_CLINIC_OR_DEPARTMENT_OTHER): Payer: BC Managed Care – PPO

## 2014-03-19 ENCOUNTER — Ambulatory Visit (HOSPITAL_COMMUNITY)
Admission: RE | Admit: 2014-03-19 | Discharge: 2014-03-19 | Disposition: A | Discharge status: Home / Self Care | Payer: BC Managed Care – PPO | Source: Ambulatory Visit | Attending: Family Medicine | Admitting: Family Medicine

## 2014-03-19 DIAGNOSIS — M25512 Pain in left shoulder: Secondary | ICD-10-CM

## 2014-03-19 LAB — GIARDIA/CRYPTOSPORIDIUM (EIA)
Cryptosporidium Screen (EIA): NEGATIVE
GIARDIA SCREEN (EIA): NEGATIVE

## 2014-03-22 ENCOUNTER — Ambulatory Visit: Payer: BC Managed Care – PPO | Admitting: Family Medicine

## 2014-03-22 ENCOUNTER — Ambulatory Visit (INDEPENDENT_AMBULATORY_CARE_PROVIDER_SITE_OTHER): Payer: BC Managed Care – PPO | Admitting: Family Medicine

## 2014-03-22 ENCOUNTER — Encounter: Payer: Self-pay | Admitting: Family Medicine

## 2014-03-22 VITALS — BP 151/99 | HR 71 | Ht 71.0 in | Wt 190.0 lb

## 2014-03-22 DIAGNOSIS — M25512 Pain in left shoulder: Secondary | ICD-10-CM

## 2014-03-22 MED ORDER — METHYLPREDNISOLONE ACETATE 40 MG/ML IJ SUSP
40.0000 mg | Freq: Once | INTRAMUSCULAR | Status: AC
  Administered 2014-03-22: 20 mg via INTRA_ARTICULAR

## 2014-03-22 MED ORDER — IBUPROFEN-FAMOTIDINE 800-26.6 MG PO TABS: 1.0000 | ORAL_TABLET | Freq: Three times a day (TID) | ORAL | Status: DC | PRN

## 2014-03-22 NOTE — Patient Instructions (Signed)
Call me in 1-2 weeks to let me know how you're doing - we may consider the other injection if this doesn't help as expected. I sent in the duexis. Call me about the other medication.

## 2014-03-26 ENCOUNTER — Encounter: Payer: Self-pay | Admitting: Family Medicine

## 2014-03-26 NOTE — Progress Notes (Signed)
Patient ID: Dennis Watkins, male   DOB: 07-06-1955, 58 y.o.   MRN: [id number]  PCP: Unice Cobble, MD  Subjective:   HPI: Patient is a 58 y.o. male here for left shoulder pain.  9/24: Patient reports he started getting shoulder pain about 6 days ago. No acute injury but he is getting back into playing golf. Hitting a lot of golf balls at range and working with a pro, lessons. Pain lateral aspect of shoulder. Tried heat and flector patch which help. Difficulty sleeping. No prior issues with this shoulder.  10/19: Patient reports pain is about the same compared to last visit. Difficulty sleeping on left side. Very painful to pick up his suitcase. Has done 3 visits of PT to date and doing home exercises. No swelling.  11/2: Patient comes in today for trial of AC joint injection.  Past Medical History  Diagnosis Date  . PUD (peptic ulcer disease) 1994    [place]  . 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 Asthma     as child;essentially resolved in teens  . Hyperlipidemia   . Gout     [facility]  . NASH (nonalcoholic steatohepatitis)     [facility]    Current Outpatient Prescriptions on File Prior to Visit  Medication Sig Dispense Refill  . albuterol (PROVENTIL HFA;VENTOLIN HFA) 108 (90 BASE) MCG/ACT inhaler Inhale 1 puff into the lungs every 6 (six) hours as needed for wheezing or shortness of breath.    . allopurinol (ZYLOPRIM) 300 MG tablet Take 300 mg by mouth daily.    Marland Kitchen aspirin 81 MG tablet Take 81 mg by mouth daily.      Marland Kitchen azelastine (ASTEPRO) 137 MCG/SPRAY nasal spray Place 1 spray into the nose 2 (two) times daily as needed. Use in each nostril as directed    . cetirizine (ZYRTEC) 10 MG tablet Take 10 mg by mouth daily.    Marland Kitchen FLOVENT HFA 44 MCG/ACT inhaler     . fluticasone (FLONASE) 50 MCG/ACT nasal spray Place 1 spray into both nostrils daily.    . Fluticasone-Salmeterol (ADVAIR) 250-50 MCG/DOSE AEPB Inhale 1 puff into the lungs as needed.    . Ginger, Zingiber  officinalis, (GINGER PO) Take 1 tablet by mouth daily.     . Ginkgo Biloba (GNP GINGKO BILOBA EXTRACT PO) Take 1 tablet by mouth daily.     Marland Kitchen GINSENG PO Take 1 tablet by mouth daily.     Marland Kitchen ibuprofen (ADVIL,MOTRIN) 200 MG tablet Take 600 mg by mouth every 6 (six) hours as needed for mild pain or moderate pain.    Marland Kitchen ketoconazole (NIZORAL) 2 % cream     . montelukast (SINGULAIR) 10 MG tablet Take 10 mg by mouth daily.    . Multiple Vitamin (MULTIVITAMIN) tablet Take 1 tablet by mouth daily.      . pantoprazole (PROTONIX) 40 MG tablet     . ranitidine (ZANTAC) 300 MG tablet     . zolpidem (AMBIEN) 5 MG tablet Take 5 mg by mouth at bedtime as needed for sleep.     No current facility-administered medications on file prior to visit.    Past Surgical History  Procedure Laterality Date  . Tonsillectomy    . Nasal septum surgery    . Upper gastrointestinal endoscopy  1997 & 2007  . Colonoscopy  1994    [place]  . Colonoscopy  2007    [facility]    Allergies  Allergen Reactions  .  Indocin [Indomethacin]     Peptic ulcer 1994 in [place]    History   Social History  . Marital Status: Divorced    Spouse Name: N/A    Number of Children: N/A  . Years of Education: N/A   Occupational History  . 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 Smoking status: Never Smoker   . Smokeless tobacco: Not on file  . Alcohol Use: Yes     Comment: minimally  . Drug Use: No  . Sexual Activity: Not on file   Other Topics Concern  . Not on file   Social History Narrative   Single   Regular exercise;biking walking 2-3 xweekly    Family History  Problem Relation Age of Onset  . Breast cancer Mother   . Depression Father   . Coronary artery disease Father     CABG,MI @ 30  . Hypertension Father   . Gout Father   . Stroke      MG uncle > 32  . Diabetes Neg Hx     BP 151/99 mmHg  Pulse 71  Ht 5\' 11"  (1.803 m)  Wt 190 lb (86.183 kg)  BMI 26.51 kg/m2  Review of  Systems: See HPI above.    Objective:  Physical Exam:  Gen: NAD  Left shoulder: No swelling, ecchymoses.  No gross deformity. Mild TTP AC joint. FROM with mild painful arc. Negative Hawkins, Neers. Negative Speeds, Yergasons. Strength 5/5 with empty can and resisted internal/external rotation.  Pain empty can. Negative apprehension. Negative o'briens. NV intact distally.    Assessment & Plan:  1. 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  He also has bursitis but the [facility] arthropathy appears to be the dominant finding.  Injection given here first with ultrasound guidance.  Call us in 1-2 weeks to let us know how he is doing.  Consider subacromial injection, nitro patches if not improving.  After informed written consent patient was seated on exam table.  Area overlying left AC joint prepped with alcohol swab after identification with ultrasound.  Then Left AC joint injected with 0.5:0.5 marcaine: depomedrol.  Patient tolerated procedure well without immediate complications.

## 2014-03-26 NOTE — Assessment & Plan Note (Signed)
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  He also has bursitis but the [facility] arthropathy appears to be the dominant finding.  Injection given here first with ultrasound guidance.  Call us in 1-2 weeks to let us know how he is doing.  Consider subacromial injection, nitro patches if not improving.  After informed written consent patient was seated on exam table.  Area overlying left AC joint prepped with alcohol swab after identification with ultrasound.  Then Left AC joint injected with 0.5:0.5 marcaine: depomedrol.  Patient tolerated procedure well without immediate complications.

## 2014-04-04 ENCOUNTER — Telehealth: Payer: Self-pay | Admitting: Family Medicine

## 2014-04-04 ENCOUNTER — Encounter: Payer: Self-pay | Admitting: *Deleted

## 2014-04-04 NOTE — Telephone Encounter (Signed)
Called patient and gave him the information

## 2014-04-04 NOTE — Telephone Encounter (Signed)
Called and left message to call the office.

## 2014-04-04 NOTE — Telephone Encounter (Signed)
Oh he was supposed to let us know if he is better, same, how he's feeling.  Nevin Bloodgood can you call and see how he's doing.  If not better we discussed considering a shot for bursitis instead of arthritis.

## 2014-04-04 NOTE — Telephone Encounter (Signed)
Spoke with patient and he stated that most of the pain is gone from the arthritis however does feel some pressure with certain movements. Stated that the shoulder does not feel like it is moves smoothly. Would this be due to the bursitis?

## 2014-04-04 NOTE — Telephone Encounter (Signed)
I think that part is likely due to the bursitis and impingement.  If the pain is much better though I would focus on home exercises at this point.  If he's still in a decent amount of pain we could try a shot there.

## 2014-05-02 ENCOUNTER — Telehealth: Payer: Self-pay | Admitting: Family Medicine

## 2014-05-02 NOTE — Telephone Encounter (Signed)
Called and LM for patient to call office.

## 2014-05-02 NOTE — Telephone Encounter (Signed)
That suggests it's the right location as far as where his pain is coming from.  The arthritis at the [facility] joint will always be there.  It's hard to say what will happen because it's only been a day since he hit some golf balls.  It's possible he did a little too much and flared it up some.  I'd wait over the course of the week and see what happens - I wouldn't recommend repeating the injection or nitro patches or any of that stuff yet.  I don't think there's anything he can do to give his shoulder rotation a 'smooth feeling' though.  Even if he were to undergo surgery or injections or therapy that's probably not a correctable feeling.

## 2014-05-02 NOTE — Telephone Encounter (Signed)
Spoke with patient and he stated that the injection in the left shoulder made a huge difference. Started getting some discomfort in the shoulder about a week ago along about the time started playing golf on a regular basis. Stated that he hit a bucket of golf balls yesterday (05-01-14) and felt some discomfort today (05-02-14). Stated that the rotation of the shoulder does not have a smooth feeling. Has not tried the nitro patches or a subacromial injection. Would like to know if this will heal on its own if does not do the patches or injection.

## 2014-05-14 ENCOUNTER — Ambulatory Visit (INDEPENDENT_AMBULATORY_CARE_PROVIDER_SITE_OTHER): Payer: BC Managed Care – PPO | Admitting: Internal Medicine

## 2014-05-14 ENCOUNTER — Telehealth: Payer: Self-pay | Admitting: Internal Medicine

## 2014-05-14 ENCOUNTER — Encounter: Payer: Self-pay | Admitting: Internal Medicine

## 2014-05-14 VITALS — BP 130/90 | HR 100 | Temp 98.3°F | Wt 205.2 lb

## 2014-05-14 DIAGNOSIS — L089 Local infection of the skin and subcutaneous tissue, unspecified: Secondary | ICD-10-CM

## 2014-05-14 DIAGNOSIS — L918 Other hypertrophic disorders of the skin: Secondary | ICD-10-CM

## 2014-05-14 MED ORDER — MUPIROCIN 2 % EX OINT: TOPICAL_OINTMENT | CUTANEOUS | Status: DC

## 2014-05-14 NOTE — Telephone Encounter (Signed)
Pt called earlier inquiring if Dr Linna Darner could removed a skin tag, I checked with assistant who asked Linus Orn he said dermatology could remove,  I informed pt. Pt called dermatologists and they cannot see him until February 2016 at the soonest. Pt is concerned as it is red, inflammed and double the size, does he need OV today with PCP? Pls advise. [phone]

## 2014-05-14 NOTE — Progress Notes (Signed)
Pre visit review using our clinic review tool, if applicable. No additional management support is needed unless otherwise documented below in the visit note. 

## 2014-05-14 NOTE — Progress Notes (Signed)
   Subjective:    Patient ID: Dennis Watkins, male    DOB: 1956-02-17, 58 y.o.   MRN: [id number]  HPI   He has noted a left axillary skin tag within the last 4 days; he believes it is getting larger .It has become reddish in color.  He has been able to schedule a Dermatology appointment in [place] tomorrow through a Pediatrician he knows.  Local Dermatology referral could not be completed until February.  He has no history of melanoma or skin cancer.  Review of Systems   He denies fever, chills, sweats or weight loss.     Objective:   Physical Exam  There is a small polypoid skin tag in the left axilla which is bilobed. One of the 2 lobes is slightly erythematous as is the base of the polyp.  He has no cervical or axillary lymphadenopathy.  He has no organomegaly.      Assessment & Plan:  #1 inflammed bilobed polypoid skin tag See orders & recommendations

## 2014-05-14 NOTE — Telephone Encounter (Signed)
Pt informed to come in at 4:45 pm today to see Dr Linna Darner

## 2014-05-14 NOTE — Patient Instructions (Signed)
Apply the Bactroban 2  times a day after cleansing the skin tag.

## 2014-05-14 NOTE — Telephone Encounter (Signed)
Same day if possible.

## 2014-08-09 ENCOUNTER — Encounter: Payer: Self-pay | Admitting: Internal Medicine

## 2014-08-09 ENCOUNTER — Other Ambulatory Visit: Payer: Self-pay | Admitting: Internal Medicine

## 2014-08-09 DIAGNOSIS — M25512 Pain in left shoulder: Secondary | ICD-10-CM

## 2014-09-13 ENCOUNTER — Other Ambulatory Visit: Payer: Self-pay

## 2014-09-13 MED ORDER — ZOLPIDEM TARTRATE 5 MG PO TABS: ORAL_TABLET | ORAL | Status: DC

## 2014-09-13 NOTE — Telephone Encounter (Signed)
I do not Rx Ambien beyond 1 pill every 3rd day as needed only due to long term risks ( no more than 10 pills max/ month) If insomnia is a chronic problem; Sleep specialty evaluation is indicated.  Adverse effects include tolerance or lack of effectiveness; automated  behavior such as driving or eating while actually asleep ; & also potential  for habituation.

## 2014-09-13 NOTE — Telephone Encounter (Signed)
Zolpidem has been called to [facility] on [address] to take 1 tablet every 3rd night as needed only

## 2014-09-20 ENCOUNTER — Other Ambulatory Visit: Payer: Self-pay | Admitting: Internal Medicine

## 2014-09-20 ENCOUNTER — Ambulatory Visit (INDEPENDENT_AMBULATORY_CARE_PROVIDER_SITE_OTHER): Payer: BC Managed Care – PPO | Admitting: Internal Medicine

## 2014-09-20 ENCOUNTER — Encounter: Payer: Self-pay | Admitting: Internal Medicine

## 2014-09-20 ENCOUNTER — Other Ambulatory Visit (INDEPENDENT_AMBULATORY_CARE_PROVIDER_SITE_OTHER): Payer: BC Managed Care – PPO

## 2014-09-20 VITALS — BP 118/88 | HR 116 | Temp 98.8°F | Ht 71.0 in | Wt 202.2 lb

## 2014-09-20 DIAGNOSIS — R6883 Chills (without fever): Secondary | ICD-10-CM

## 2014-09-20 DIAGNOSIS — R5381 Other malaise: Secondary | ICD-10-CM | POA: Diagnosis not present

## 2014-09-20 DIAGNOSIS — D72829 Elevated white blood cell count, unspecified: Secondary | ICD-10-CM

## 2014-09-20 LAB — URINALYSIS, ROUTINE W REFLEX MICROSCOPIC
Bilirubin Urine: NEGATIVE
Ketones, ur: NEGATIVE
Leukocytes, UA: NEGATIVE
NITRITE: NEGATIVE
Specific Gravity, Urine: <=1.005 — AB (ref 1.000–1.030)
Total Protein, Urine: NEGATIVE
UROBILINOGEN UA: 0.2 (ref 0.0–1.0)
Urine Glucose: NEGATIVE
pH: 6 (ref 5.0–8.0)

## 2014-09-20 LAB — CBC WITH DIFFERENTIAL/PLATELET
BASOS ABS: 0 10*3/uL (ref 0.0–0.1)
Basophils Relative: 0.2 % (ref 0.0–3.0)
Eosinophils Absolute: 0.3 10*3/uL (ref 0.0–0.7)
Eosinophils Relative: 1.7 % (ref 0.0–5.0)
HEMATOCRIT: 46.5 % (ref 39.0–52.0)
Hemoglobin: 15.9 g/dL (ref 13.0–17.0)
LYMPHS ABS: 1.8 10*3/uL (ref 0.7–4.0)
Lymphocytes Relative: 11.9 % — ABNORMAL LOW (ref 12.0–46.0)
MCHC: 34.2 g/dL (ref 30.0–36.0)
MCV: 89.4 fl (ref 78.0–100.0)
Monocytes Absolute: 1.7 10*3/uL — ABNORMAL HIGH (ref 0.1–1.0)
Monocytes Relative: 11.2 % (ref 3.0–12.0)
Neutro Abs: 11.5 10*3/uL — ABNORMAL HIGH (ref 1.4–7.7)
Neutrophils Relative %: 75 % (ref 43.0–77.0)
PLATELETS: 291 10*3/uL (ref 150.0–400.0)
RBC: 5.19 Mil/uL (ref 4.22–5.81)
RDW: 14 % (ref 11.5–15.5)
WBC: 15.3 10*3/uL — ABNORMAL HIGH (ref 4.0–10.5)

## 2014-09-20 MED ORDER — DOXYCYCLINE HYCLATE 100 MG PO TABS: 100.0000 mg | ORAL_TABLET | Freq: Two times a day (BID) | ORAL | Status: DC

## 2014-09-20 NOTE — Patient Instructions (Signed)
NSAIDS ( Aleve, Advil, Naproxen) or Tylenol every 4 hrs as needed for fever as discussed based on label recommendationsPlease call if there is a significant change in symptoms  or progression of severity of symptoms compared to the  History as recorded in the copy of the office note you were provided. Review that record for accuracy.Share results with all MDs seen .

## 2014-09-20 NOTE — Progress Notes (Signed)
   Subjective:    Patient ID: Dennis Watkins, male    DOB: 04/06/1956, 59 y.o.   MRN: [id number]  HPI His symptoms began 09/15/14 as lethargy and head congestion. Without treatment symptoms essentially resolved. As of 4/27 at 11:30 PM he had frank rigor and chills. This was treated with ibuprofen.  He did have dental cleansing 4/27; there was no evidence of active dental disease.  Today he's been profoundly lethargic. He has severe myalgias diffusely not arthralgias. He is not on a statin. He's had a minor tickle in his throat. He's had soreness in the right foot and of the left lateral hand.  He did have the flu shot October 2015. He has not traveled internationally since October. He is around 2 outside dogs every weekend. He knows of no definite tick bites.   Review of Systems  Frontal headache, facial pain , nasal purulence, dental pain, sore throat , otic pain or otic discharge denied. Cough, sputum production, hemoptysis, or pleuritic pain denied. No abdominal pain or diarrhea present. Cola colored urine or clay colored stools denied. Dysuria, pyuria, or hematuria not present. No new rashes, pustules, vesicles. No redness or swelling of joints. No significant travel, pets, or tick exposures.     Objective:   Physical Exam  Pertinent or positive findings include: He appears uncomfortable but not acutely ill. He does move slowly as he gets on the exam table. His skin is dry but hot.  He has bursa at the elbow areas. There is no sign of meningismus with passive or active neck range of motion. Straight leg raising was also negative.  Rectal revealed no adnexal tenderness or prostate tenderness. He did have a small granuloma in the right scrotum above the testicle.  General appearance :adequately nourished; in no distress. Eyes: No conjunctival inflammation or scleral icterus is present. Oral exam:  Lips and gums are healthy appearing.There is no oropharyngeal erythema or exudate  noted. Dental hygiene is good. Heart:  Normal rate and regular rhythm. S1 and S2 normal without gallop, murmur, click, rub or other extra sounds   Lungs:Chest clear to auscultation; no wheezes, rhonchi,rales ,or rubs present.No increased work of breathing.  Abdomen: bowel sounds normal, soft and non-tender without masses, organomegaly or hernias noted.  No guarding or rebound. No flank tenderness to percussion. Vascular : all pulses equal ; no bruits present. Skin:Warm & dry.  Intact without suspicious lesions or rashes ; no tenting or jaundice  Lymphatic: No lymphadenopathy is noted about the head, neck, axilla, or inguinal areas.  Neuro: Strength, tone & DTRs normal.        Assessment & Plan:  #1 frank rigor and r chills without localizing symptoms or signs #2 leukocytosis; white count is 15,300 with a left shift. Urinalysis is negative for leukocytes or nitrates.  0-2 white cells and 3-6 red cells.  See orders

## 2014-09-20 NOTE — Progress Notes (Signed)
Pre visit review using our clinic review tool, if applicable. No additional management support is needed unless otherwise documented below in the visit note. 

## 2014-09-21 ENCOUNTER — Telehealth: Payer: Self-pay

## 2014-09-21 ENCOUNTER — Other Ambulatory Visit: Payer: BC Managed Care – PPO

## 2014-09-21 DIAGNOSIS — D72829 Elevated white blood cell count, unspecified: Secondary | ICD-10-CM

## 2014-09-21 DIAGNOSIS — R6883 Chills (without fever): Secondary | ICD-10-CM

## 2014-09-21 NOTE — Telephone Encounter (Signed)
I spoke with Tonya at [facility] lab. She states she will get the urine sent out

## 2014-09-21 NOTE — Telephone Encounter (Signed)
-----   Message from Hendricks Limes, MD sent at 09/21/2014  1:04 PM EDT ----- Urine culture ordered; please ask Lab to process. Thanks, Hopp(working in car)

## 2014-09-23 LAB — URINE CULTURE
COLONY COUNT: NO GROWTH
Organism ID, Bacteria: NO GROWTH

## 2014-09-24 ENCOUNTER — Other Ambulatory Visit: Payer: Self-pay | Admitting: Internal Medicine

## 2014-09-24 ENCOUNTER — Encounter: Payer: Self-pay | Admitting: Internal Medicine

## 2014-09-24 ENCOUNTER — Other Ambulatory Visit (INDEPENDENT_AMBULATORY_CARE_PROVIDER_SITE_OTHER): Payer: BC Managed Care – PPO

## 2014-09-24 ENCOUNTER — Ambulatory Visit (INDEPENDENT_AMBULATORY_CARE_PROVIDER_SITE_OTHER): Payer: BC Managed Care – PPO

## 2014-09-24 DIAGNOSIS — R509 Fever, unspecified: Secondary | ICD-10-CM

## 2014-09-24 DIAGNOSIS — R6889 Other general symptoms and signs: Secondary | ICD-10-CM

## 2014-09-24 LAB — HEPATIC FUNCTION PANEL
ALT: 45 U/L (ref 0–53)
AST: 20 U/L (ref 0–37)
Albumin: 3.9 g/dL (ref 3.5–5.2)
Alkaline Phosphatase: 57 U/L (ref 39–117)
BILIRUBIN TOTAL: 0.6 mg/dL (ref 0.2–1.2)
Bilirubin, Direct: 0.1 mg/dL (ref 0.0–0.3)
Total Protein: 7.4 g/dL (ref 6.0–8.3)

## 2014-09-24 LAB — POCT RAPID STREP A (OFFICE): RAPID STREP A SCREEN: NEGATIVE

## 2014-09-24 LAB — MONONUCLEOSIS SCREEN: MONO SCREEN: NEGATIVE

## 2014-09-24 NOTE — Progress Notes (Signed)
Strep test is negative.

## 2014-09-25 ENCOUNTER — Other Ambulatory Visit: Payer: Self-pay | Admitting: Internal Medicine

## 2014-09-25 DIAGNOSIS — B2700 Gammaherpesviral mononucleosis without complication: Secondary | ICD-10-CM

## 2014-09-25 LAB — INFLUENZA A AND B
INFLUENZA A AG: NEGATIVE
INFLUENZA B AG: NEGATIVE

## 2014-09-25 LAB — EPSTEIN-BARR VIRUS NUCLEAR ANTIGEN ANTIBODY, IGG: EBV NA IgG: 288 U/mL — ABNORMAL HIGH (ref <18.0)

## 2014-09-26 ENCOUNTER — Encounter: Payer: Self-pay | Admitting: Internal Medicine

## 2014-09-26 ENCOUNTER — Other Ambulatory Visit (INDEPENDENT_AMBULATORY_CARE_PROVIDER_SITE_OTHER): Payer: BC Managed Care – PPO

## 2014-09-26 ENCOUNTER — Ambulatory Visit (INDEPENDENT_AMBULATORY_CARE_PROVIDER_SITE_OTHER): Payer: BC Managed Care – PPO | Admitting: Internal Medicine

## 2014-09-26 VITALS — BP 144/100 | HR 90 | Temp 98.3°F | Ht 71.0 in | Wt 204.2 lb

## 2014-09-26 DIAGNOSIS — R05 Cough: Secondary | ICD-10-CM | POA: Diagnosis not present

## 2014-09-26 DIAGNOSIS — D72829 Elevated white blood cell count, unspecified: Secondary | ICD-10-CM | POA: Diagnosis not present

## 2014-09-26 DIAGNOSIS — C8589 Other specified types of non-Hodgkin lymphoma, extranodal and solid organ sites: Secondary | ICD-10-CM

## 2014-09-26 DIAGNOSIS — R5381 Other malaise: Secondary | ICD-10-CM | POA: Diagnosis not present

## 2014-09-26 DIAGNOSIS — R059 Cough, unspecified: Secondary | ICD-10-CM

## 2014-09-26 DIAGNOSIS — B2709 Gammaherpesviral mononucleosis with other complications: Secondary | ICD-10-CM

## 2014-09-26 DIAGNOSIS — B2700 Gammaherpesviral mononucleosis without complication: Secondary | ICD-10-CM

## 2014-09-26 LAB — CBC WITH DIFFERENTIAL/PLATELET
Basophils Absolute: 0.1 10*3/uL (ref 0.0–0.1)
Basophils Relative: 1 % (ref 0.0–3.0)
EOS PCT: 5.5 % — AB (ref 0.0–5.0)
Eosinophils Absolute: 0.6 10*3/uL (ref 0.0–0.7)
HCT: 44.7 % (ref 39.0–52.0)
Hemoglobin: 15.3 g/dL (ref 13.0–17.0)
Lymphocytes Relative: 25.9 % (ref 12.0–46.0)
Lymphs Abs: 2.8 10*3/uL (ref 0.7–4.0)
MCHC: 34.3 g/dL (ref 30.0–36.0)
MCV: 89.7 fl (ref 78.0–100.0)
MONOS PCT: 7.8 % (ref 3.0–12.0)
Monocytes Absolute: 0.9 10*3/uL (ref 0.1–1.0)
NEUTROS PCT: 59.8 % (ref 43.0–77.0)
Neutro Abs: 6.5 10*3/uL (ref 1.4–7.7)
Platelets: 363 10*3/uL (ref 150.0–400.0)
RBC: 4.98 Mil/uL (ref 4.22–5.81)
RDW: 14 % (ref 11.5–15.5)
WBC: 10.9 10*3/uL — ABNORMAL HIGH (ref 4.0–10.5)

## 2014-09-26 NOTE — Progress Notes (Signed)
   Subjective:    Patient ID: Dennis Watkins, male    DOB: 04/21/1956, 59 y.o.   MRN: [id number]  HPI As of 4/29 he developed a cough with progressive fever and rhinitis. He was seen at the [facility] urgent care & told he had an upper respiratory tract virus. He was placed on a cough syrup with Sudafed.  At this time his symptoms include low energy, throat congestion, nonproductive cough, minor sore throat, some shortness of breath, and minor sneezing.  Lab tests done to date indicate an elevated white count with slight left shift. Flu and strep screens were negative. EB virus was strongly positive. Additional studies to determine whether this represents an acute process versus a remote EBV related Infection will be drawn.  Review of Systems Frontal headache, facial pain , nasal purulence, dental pain, sore throat , otic pain or otic discharge denied. No fever , chills or sweats @ this time.    Objective:   Physical Exam Pertinent or positive findings include: In the right nare there is horizontal linear irregular tissue which may be related to his prior septoplasty.  At the posterior mandibular angle there is slight prominence of subcutaneous tissue. He has no palpable cervical lymphadenopathy.  The oropharynx is unremarkable with no exudate.  There is no evidence of splenomegaly.  General appearance:Adequately nourished; no acute distress or increased work of breathing is present.    Lymphatic: No  Lymphadenopathy in axilla .  Eyes: No conjunctival inflammation or lid edema is present. There is no scleral icterus.  Ears:  External ear exam shows no significant lesions or deformities.  Otoscopic examination reveals clear canals, tympanic membranes are intact bilaterally without bulging, retraction, inflammation or discharge.  Nose:  External nasal examination shows no deformity or inflammation. Nasal mucosa are slightly erythematous without lesions or exudates No septal dislocation or  deviation.No obstruction to airflow.   Oral exam: Dental hygiene is good; lips and gums are healthy appearing.There is no oropharyngeal erythema or exudate .  Neck:  No deformities, thyromegaly, masses, or tenderness noted.   Supple with full range of motion without pain.   Heart:  Normal rate and regular rhythm. S1 and S2 normal without gallop, murmur, click, rub or other extra sounds.   Lungs:Chest clear to auscultation; no wheezes, rhonchi,rales ,or rubs present.  Extremities:  No cyanosis, edema, or clubbing  noted    Skin: Warm & dry w/o tenting or jaundice. No significant lesions or rash.        Assessment & Plan:  #1 Nonproductive cough  #2 malaise  #3 leukocytosis  #4 positive EBV virus titer  Plan: See orders. If the white count and differential do suggest viral illness; the tetracycline will be discontinued. The additional EBV serology studies will help determine whether a recent or remote infection is suggested. If these suggest remote infection; he will be monitored for possible EBV related Burkitt's lymphoma or nasopharyngeal cancer. This is certainly unlikely as his history was one of an acute illness.

## 2014-09-26 NOTE — Patient Instructions (Signed)
  Your next office appointment will be determined based upon review of your pending labs.  Those instructions will be transmitted to you by My Chart    Critical results will be called.   Followup as needed for any active or acute issue. Please report any significant change in your symptoms. 

## 2014-09-26 NOTE — Progress Notes (Signed)
Pre visit review using our clinic review tool, if applicable. No additional management support is needed unless otherwise documented below in the visit note. 

## 2014-09-27 LAB — EPSTEIN-BARR VIRUS VCA, IGG: EBV VCA IgG: 189 U/mL — ABNORMAL HIGH (ref <18.0)

## 2014-09-27 LAB — EPSTEIN-BARR VIRUS VCA, IGM

## 2014-10-26 ENCOUNTER — Ambulatory Visit (INDEPENDENT_AMBULATORY_CARE_PROVIDER_SITE_OTHER): Payer: BC Managed Care – PPO | Admitting: Internal Medicine

## 2014-10-26 ENCOUNTER — Ambulatory Visit (INDEPENDENT_AMBULATORY_CARE_PROVIDER_SITE_OTHER)
Admission: RE | Admit: 2014-10-26 | Discharge: 2014-10-26 | Disposition: A | Discharge status: Home / Self Care | Payer: BC Managed Care – PPO | Source: Ambulatory Visit | Attending: Internal Medicine | Admitting: Internal Medicine

## 2014-10-26 ENCOUNTER — Other Ambulatory Visit (INDEPENDENT_AMBULATORY_CARE_PROVIDER_SITE_OTHER): Payer: BC Managed Care – PPO

## 2014-10-26 ENCOUNTER — Other Ambulatory Visit: Payer: Self-pay | Admitting: Internal Medicine

## 2014-10-26 ENCOUNTER — Encounter: Payer: Self-pay | Admitting: Internal Medicine

## 2014-10-26 VITALS — BP 138/86 | HR 96 | Temp 98.5°F | Resp 16 | Ht 71.0 in | Wt 204.0 lb

## 2014-10-26 DIAGNOSIS — J0110 Acute frontal sinusitis, unspecified: Secondary | ICD-10-CM

## 2014-10-26 DIAGNOSIS — J209 Acute bronchitis, unspecified: Secondary | ICD-10-CM | POA: Diagnosis not present

## 2014-10-26 DIAGNOSIS — J4521 Mild intermittent asthma with (acute) exacerbation: Secondary | ICD-10-CM | POA: Diagnosis not present

## 2014-10-26 DIAGNOSIS — J683 Other acute and subacute respiratory conditions due to chemicals, gases, fumes and vapors: Secondary | ICD-10-CM

## 2014-10-26 DIAGNOSIS — R000 Tachycardia, unspecified: Secondary | ICD-10-CM | POA: Diagnosis not present

## 2014-10-26 LAB — CBC WITH DIFFERENTIAL/PLATELET
Basophils Absolute: 0 10*3/uL (ref 0.0–0.1)
Basophils Relative: 0 % (ref 0.0–3.0)
Eosinophils Absolute: 0 10*3/uL (ref 0.0–0.7)
Eosinophils Relative: 0.1 % (ref 0.0–5.0)
HEMATOCRIT: 42.4 % (ref 39.0–52.0)
Hemoglobin: 14.1 g/dL (ref 13.0–17.0)
LYMPHS ABS: 1.1 10*3/uL (ref 0.7–4.0)
Lymphocytes Relative: 9.2 % — ABNORMAL LOW (ref 12.0–46.0)
MCHC: 33.3 g/dL (ref 30.0–36.0)
MCV: 91.3 fl (ref 78.0–100.0)
MONO ABS: 0.4 10*3/uL (ref 0.1–1.0)
MONOS PCT: 3.7 % (ref 3.0–12.0)
Neutro Abs: 10 10*3/uL — ABNORMAL HIGH (ref 1.4–7.7)
Platelets: 255 10*3/uL (ref 150.0–400.0)
RBC: 4.64 Mil/uL (ref 4.22–5.81)
RDW: 14.2 % (ref 11.5–15.5)
WBC: 11.5 10*3/uL — AB (ref 4.0–10.5)

## 2014-10-26 MED ORDER — BUDESONIDE-FORMOTEROL FUMARATE 160-4.5 MCG/ACT IN AERO: 2.0000 | INHALATION_SPRAY | Freq: Two times a day (BID) | RESPIRATORY_TRACT | Status: DC

## 2014-10-26 MED ORDER — LEVOFLOXACIN 500 MG PO TABS: 500.0000 mg | ORAL_TABLET | Freq: Every day | ORAL | Status: DC

## 2014-10-26 MED ORDER — PREDNISONE 20 MG PO TABS: ORAL_TABLET | ORAL | Status: DC

## 2014-10-26 NOTE — Patient Instructions (Addendum)
Albuterol is a rescue inhaler which should be used as infrequently as possible; it should never be used more than 1-2 puffs every 4 hours.  If it is required more than 2-3 times per week; the asthma is not well controlled. Symbicort , Dulera , Advair or other maintenance agent should be considered to control smooth muscle spasm and airway inflammation  and to prevent adverse effects from excess albuterol use.Those adverse effects can include health or life threatening heart rhythm irregularities.  Symbicort one - two inhalations every 12 hours; gargle and spit after use.  As we discussed please do not use the Advair and Flovent while on the Symbicort. Please discontinue the decongestion pseudoephedrine as this can also can cause cardiac irregularities.

## 2014-10-26 NOTE — Progress Notes (Signed)
Pre visit review using our clinic review tool, if applicable. No additional management support is needed unless otherwise documented below in the visit note. 

## 2014-10-26 NOTE — Progress Notes (Signed)
   Subjective:    Patient ID: Dennis Watkins, male    DOB: 16-Jan-1956, 59 y.o.   MRN: [id number]  HPI His symptoms started 2 weeks ago during the last few days of his [place] trip. He had nasal discharge which varied from clear initially to green and finally light. He also had pressure discomfort in both the frontal and maxillary sinuses. He had intermittent frontal headache. He has been experiencing lethargy and fatigue. He also has sore throat which has progressed in the last few days. Temperature was up to 101 in the last week. He also has had pressure in the ears. Cough has been productive of thick white sputum. He took 7 days of amoxicillin which he had left over. He then started Zithromax & is on the 4th day.  He's been using albuterol nebulizer every 3 hours over the last 3 days. He has continued Advair twice a day . Also he has ben taking decongestants.   He is getting ready to fly back to [place].  Review of Systems He does not have dental pain or otic discharge. He also denies extrinsic symptoms of itchy, watery eyes. No definite wheezing or dyspnea ; but he feels as if he is not breathing deeply enough.    Objective:   Physical Exam The nasal mucosa is dry. He does exhibit breathing shallowly with recurrent dry cough.  General appearance:Adequately nourished; no acute distress or increased work of breathing is present.   Lymphatic: No  lymphadenopathy about the head, neck, or axilla . Eyes: No conjunctival inflammation or lid edema is present. There is no scleral icterus. Ears:  External ear exam shows no significant lesions or deformities.  Otoscopic examination reveals clear canals, tympanic membranes are intact bilaterally without bulging, retraction, inflammation or discharge. Nose:  External nasal examination shows no deformity or inflammation.No septal dislocation or deviation.No obstruction to airflow.  Oral exam: Dental hygiene is good; lips and gums are healthy  appearing.There is no oropharyngeal erythema or exudate . Neck:  No deformities, thyromegaly, masses, or tenderness noted.   Supple with full range of motion without pain.  Heart:  Tachycardia with regular rhythm. S1 and S2 normal without gallop, murmur, click, rub or other extra sounds.  Lungs:Chest clear to auscultation; no wheezes, rhonchi,rales ,or rubs present. Extremities:  No cyanosis, edema, or clubbing  noted  Skin: Warm & dry w/o tenting or jaundice. No significant lesions or rash.      Assessment & Plan:  #1 acute rhinosinusitis  #2 acute bronchitis  #3 albuterol abuse; risk discussed  #4 tachycardia in context of #3 and decongestant use  Chest x-ray reveals no acute process; it does reveal suboptimal inflation. CBC and differential reveals a left shift.

## 2014-12-03 ENCOUNTER — Encounter: Payer: Self-pay | Admitting: Internal Medicine

## 2014-12-06 ENCOUNTER — Encounter: Payer: Self-pay | Admitting: Internal Medicine

## 2015-03-20 ENCOUNTER — Encounter: Payer: Self-pay | Admitting: Internal Medicine

## 2015-03-21 ENCOUNTER — Other Ambulatory Visit: Payer: Self-pay | Admitting: Emergency Medicine

## 2015-03-21 MED ORDER — MONTELUKAST SODIUM 10 MG PO TABS: 10.0000 mg | ORAL_TABLET | Freq: Every day | ORAL | Status: DC

## 2015-03-21 MED ORDER — ALLOPURINOL 300 MG PO TABS: 300.0000 mg | ORAL_TABLET | Freq: Every day | ORAL | Status: DC

## 2015-05-14 ENCOUNTER — Other Ambulatory Visit: Payer: Self-pay | Admitting: *Deleted

## 2015-05-14 MED ORDER — PANTOPRAZOLE SODIUM 40 MG PO TBEC: 40.0000 mg | DELAYED_RELEASE_TABLET | Freq: Every day | ORAL | Status: DC

## 2015-06-24 ENCOUNTER — Other Ambulatory Visit: Payer: Self-pay | Admitting: Neurology

## 2015-06-24 MED ORDER — PANTOPRAZOLE SODIUM 40 MG PO TBEC: 40.0000 mg | DELAYED_RELEASE_TABLET | Freq: Every day | ORAL | Status: DC

## 2015-07-05 ENCOUNTER — Encounter: Payer: Self-pay | Admitting: Internal Medicine

## 2015-07-05 ENCOUNTER — Encounter: Payer: BC Managed Care – PPO | Admitting: Internal Medicine

## 2015-07-05 ENCOUNTER — Other Ambulatory Visit (INDEPENDENT_AMBULATORY_CARE_PROVIDER_SITE_OTHER): Payer: BC Managed Care – PPO

## 2015-07-05 ENCOUNTER — Ambulatory Visit (INDEPENDENT_AMBULATORY_CARE_PROVIDER_SITE_OTHER): Payer: BC Managed Care – PPO | Admitting: Internal Medicine

## 2015-07-05 VITALS — BP 158/88 | HR 98 | Temp 98.4°F | Resp 18 | Ht 70.0 in | Wt 205.6 lb

## 2015-07-05 DIAGNOSIS — J4520 Mild intermittent asthma, uncomplicated: Secondary | ICD-10-CM | POA: Diagnosis not present

## 2015-07-05 DIAGNOSIS — E785 Hyperlipidemia, unspecified: Secondary | ICD-10-CM

## 2015-07-05 DIAGNOSIS — Z0000 Encounter for general adult medical examination without abnormal findings: Secondary | ICD-10-CM | POA: Insufficient documentation

## 2015-07-05 DIAGNOSIS — M109 Gout, unspecified: Secondary | ICD-10-CM | POA: Diagnosis not present

## 2015-07-05 LAB — COMPREHENSIVE METABOLIC PANEL
ALT: 66 U/L — ABNORMAL HIGH (ref 0–53)
AST: 31 U/L (ref 0–37)
Albumin: 4.3 g/dL (ref 3.5–5.2)
Alkaline Phosphatase: 69 U/L (ref 39–117)
BUN: 17 mg/dL (ref 6–23)
CHLORIDE: 106 meq/L (ref 96–112)
CO2: 26 mEq/L (ref 19–32)
Calcium: 9.4 mg/dL (ref 8.4–10.5)
Creatinine, Ser: 1.13 mg/dL (ref 0.40–1.50)
GFR: 70.39 mL/min (ref >60.00)
GLUCOSE: 91 mg/dL (ref 70–99)
POTASSIUM: 4 meq/L (ref 3.5–5.1)
SODIUM: 140 meq/L (ref 135–145)
Total Bilirubin: 0.6 mg/dL (ref 0.2–1.2)
Total Protein: 7.6 g/dL (ref 6.0–8.3)

## 2015-07-05 LAB — FERRITIN: Ferritin: 71.7 ng/mL (ref 22.0–322.0)

## 2015-07-05 LAB — TSH: TSH: 2.5 u[IU]/mL (ref 0.35–4.50)

## 2015-07-05 LAB — HEMOGLOBIN A1C: Hgb A1c MFr Bld: 6 % (ref 4.6–6.5)

## 2015-07-05 MED ORDER — ALLOPURINOL 300 MG PO TABS: 300.0000 mg | ORAL_TABLET | Freq: Every day | ORAL | Status: AC

## 2015-07-05 MED ORDER — PANTOPRAZOLE SODIUM 40 MG PO TBEC: 40.0000 mg | DELAYED_RELEASE_TABLET | Freq: Every day | ORAL | Status: DC

## 2015-07-05 MED ORDER — ZOLPIDEM TARTRATE 5 MG PO TABS: ORAL_TABLET | ORAL | Status: DC

## 2015-07-05 MED ORDER — ALBUTEROL SULFATE HFA 108 (90 BASE) MCG/ACT IN AERS: 1.0000 | INHALATION_SPRAY | Freq: Four times a day (QID) | RESPIRATORY_TRACT | Status: AC | PRN

## 2015-07-05 NOTE — Progress Notes (Signed)
   Subjective:    Patient ID: Dennis Watkins, male    DOB: May 31, 1955, 60 y.o.   MRN: [id number]  HPI The patient is a 60 YO male coming in for wellness. No new concerns but some old problems.   PMH, [facility], social history reviewed and updated.   Review of Systems  Constitutional: Negative for fever, activity change, appetite change, fatigue and unexpected weight change.  HENT: Negative.   Eyes: Negative.   Respiratory: Negative for cough, chest tightness, shortness of breath and wheezing.   Cardiovascular: Negative for chest pain, palpitations and leg swelling.  Gastrointestinal: Negative for nausea, abdominal pain, diarrhea, constipation and abdominal distention.  Musculoskeletal: Negative.   Skin: Negative.   Neurological: Negative.   Psychiatric/Behavioral: Positive for sleep disturbance. Negative for behavioral problems, dysphoric mood and decreased concentration. The patient is not nervous/anxious.       Objective:   Physical Exam  Constitutional: He is oriented to person, place, and time. He appears well-developed and well-nourished.  HENT:  Head: Normocephalic and atraumatic.  Eyes: EOM are normal.  Neck: Normal range of motion.  Cardiovascular: Normal rate and regular rhythm.   Pulmonary/Chest: Effort normal and breath sounds normal. No respiratory distress. He has no wheezes. He has no rales.  Abdominal: Soft. Bowel sounds are normal. He exhibits no distension. There is no tenderness. There is no rebound.  Musculoskeletal: He exhibits no edema.  Neurological: He is alert and oriented to person, place, and time. Coordination normal.  Skin: Skin is warm and dry.  Psychiatric: He has a normal mood and affect.   Filed Vitals:   07/05/15 1402  BP: 158/88  Pulse: 98  Temp: 98.4 F (36.9 C)  TempSrc: Oral  Resp: 18  Height: 5\' 10"  (1.778 m)  Weight: 205 lb 9.6 oz (93.26 kg)  SpO2: 98%   EKG: Rate 82, sinus, intervals and axis normal, no ST or t wave changes, no change  from description of prior    Assessment & Plan:

## 2015-07-05 NOTE — Patient Instructions (Signed)
We have sent in the refills of your medicines today.   We have done the EKG which is normal.   We will check the blood work and call you back with the results.   Health Maintenance, Male A healthy lifestyle and preventative care can promote health and wellness.  Maintain regular health, dental, and eye exams.  Eat a healthy diet. Foods like vegetables, fruits, whole grains, low-fat dairy products, and lean protein foods contain the nutrients you need and are low in calories. Decrease your intake of foods high in solid fats, added sugars, and salt. Get information about a proper diet from your health care provider, if necessary.  Regular physical exercise is one of the most important things you can do for your health. Most adults should get at least 150 minutes of moderate-intensity exercise (any activity that increases your heart rate and causes you to sweat) each week. In addition, most adults need muscle-strengthening exercises on 2 or more days a week.   Maintain a healthy weight. The body mass index (BMI) is a screening tool to identify possible weight problems. It provides an estimate of body fat based on height and weight. Your health care provider can find your BMI and can help you achieve or maintain a healthy weight. For males 20 years and older:  A BMI below 18.5 is considered underweight.  A BMI of 18.5 to 24.9 is normal.  A BMI of 25 to 29.9 is considered overweight.  A BMI of 30 and above is considered obese.  Maintain normal blood lipids and cholesterol by exercising and minimizing your intake of saturated fat. Eat a balanced diet with plenty of fruits and vegetables. Blood tests for lipids and cholesterol should begin at age 5 and be repeated every 5 years. If your lipid or cholesterol levels are high, you are over age 27, or you are at high risk for heart disease, you may need your cholesterol levels checked more frequently.Ongoing high lipid and cholesterol levels should  be treated with medicines if diet and exercise are not working.  If you smoke, find out from your health care provider how to quit. If you do not use tobacco, do not start.  Lung cancer screening is recommended for adults aged 27-80 years who are at high risk for developing lung cancer because of a history of smoking. A yearly low-dose CT scan of the lungs is recommended for people who have at least a 30-pack-year history of smoking and are current smokers or have quit within the past 15 years. A pack year of smoking is smoking an average of 1 pack of cigarettes a day for 1 year (for example, a 30-pack-year history of smoking could mean smoking 1 pack a day for 30 years or 2 packs a day for 15 years). Yearly screening should continue until the smoker has stopped smoking for at least 15 years. Yearly screening should be stopped for people who develop a health problem that would prevent them from having lung cancer treatment.  If you choose to drink alcohol, do not have more than 2 drinks per day. One drink is considered to be 12 oz (360 mL) of beer, 5 oz (150 mL) of wine, or 1.5 oz (45 mL) of liquor.  Avoid the use of street drugs. Do not share needles with anyone. Ask for help if you need support or instructions about stopping the use of drugs.  High blood pressure causes heart disease and increases the risk of stroke. High blood  pressure is more likely to develop in:  People who have blood pressure in the end of the normal range (100-139/85-89 mm Hg).  People who are overweight or obese.  People who are African American.  If you are 88-28 years of age, have your blood pressure checked every 3-5 years. If you are 15 years of age or older, have your blood pressure checked every year. You should have your blood pressure measured twice--once when you are at a hospital or clinic, and once when you are not at a hospital or clinic. Record the average of the two measurements. To check your blood pressure  when you are not at a hospital or clinic, you can use:  An automated blood pressure machine at a pharmacy.  A home blood pressure monitor.  If you are 63-40 years old, ask your health care provider if you should take aspirin to prevent heart disease.  Diabetes screening involves taking a blood sample to check your fasting blood sugar level. This should be done once every 3 years after age 76 if you are at a normal weight and without risk factors for diabetes. Testing should be considered at a younger age or be carried out more frequently if you are overweight and have at least 1 risk factor for diabetes.  Colorectal cancer can be detected and often prevented. Most routine colorectal cancer screening begins at the age of 1 and continues through age 15. However, your health care provider may recommend screening at an earlier age if you have risk factors for colon cancer. On a yearly basis, your health care provider may provide home test kits to check for hidden blood in the stool. A small camera at the end of a tube may be used to directly examine the colon (sigmoidoscopy or colonoscopy) to detect the earliest forms of colorectal cancer. Talk to your health care provider about this at age 24 when routine screening begins. A direct exam of the colon should be repeated every 5-10 years through age 58, unless early forms of precancerous polyps or small growths are found.  People who are at an increased risk for hepatitis B should be screened for this virus. You are considered at high risk for hepatitis B if:  You were born in a country where hepatitis B occurs often. Talk with your health care provider about which countries are considered high risk.  Your parents were born in a high-risk country and you have not received a shot to protect against hepatitis B (hepatitis B vaccine).  You have HIV or AIDS.  You use needles to inject street drugs.  You live with, or have sex with, someone who has  hepatitis B.  You are a man who has sex with other men (MSM).  You get hemodialysis treatment.  You take certain medicines for conditions like cancer, organ transplantation, and autoimmune conditions.  Hepatitis C blood testing is recommended for all people born from [birth year] through 1965 and any individual with known risk factors for hepatitis C.  Healthy men should no longer receive prostate-specific antigen (PSA) blood tests as part of routine cancer screening. Talk to your health care provider about prostate cancer screening.  Testicular cancer screening is not recommended for adolescents or adult males who have no symptoms. Screening includes self-exam, a health care provider exam, and other screening tests. Consult with your health care provider about any symptoms you have or any concerns you have about testicular cancer.  Practice safe sex. Use condoms and avoid  high-risk sexual practices to reduce the spread of sexually transmitted infections (STIs).  You should be screened for STIs, including gonorrhea and chlamydia if:  You are sexually active and are younger than 24 years.  You are older than 24 years, and your health care provider tells you that you are at risk for this type of infection.  Your sexual activity has changed since you were last screened, and you are at an increased risk for chlamydia or gonorrhea. Ask your health care provider if you are at risk.  If you are at risk of being infected with HIV, it is recommended that you take a prescription medicine daily to prevent HIV infection. This is called pre-exposure prophylaxis (PrEP). You are considered at risk if:  You are a man who has sex with other men (MSM).  You are a heterosexual man who is sexually active with multiple partners.  You take drugs by injection.  You are sexually active with a partner who has HIV.  Talk with your health care provider about whether you are at high risk of being infected with HIV. If  you choose to begin PrEP, you should first be tested for HIV. You should then be tested every 3 months for as long as you are taking PrEP.  Use sunscreen. Apply sunscreen liberally and repeatedly throughout the day. You should seek shade when your shadow is shorter than you. Protect yourself by wearing long sleeves, pants, a wide-brimmed hat, and sunglasses year round whenever you are outdoors.  Tell your health care provider of new moles or changes in moles, especially if there is a change in shape or color. Also, tell your health care provider if a mole is larger than the size of a pencil eraser.  A one-time screening for abdominal aortic aneurysm (AAA) and surgical repair of large AAAs by ultrasound is recommended for men aged 69-75 years who are current or former smokers.  Stay current with your vaccines (immunizations).   This information is not intended to replace advice given to you by your health care provider. Make sure you discuss any questions you have with your health care provider.   Document Released: 11/07/2007 Document Revised: 06/01/2014 Document Reviewed: 10/06/2010 Elsevier Interactive Patient Education [employer].

## 2015-07-05 NOTE — Assessment & Plan Note (Signed)
Refill allopurinol, no flare.

## 2015-07-05 NOTE — Progress Notes (Signed)
Pre visit review using our clinic review tool, if applicable. No additional management support is needed unless otherwise documented below in the visit note. 

## 2015-07-05 NOTE — Assessment & Plan Note (Signed)
EKG done without change, checking labs, referred for colonoscopy repeat. Does exercise and non-smoker. Counseled about sun protection and mole check on the back was normal.

## 2015-07-05 NOTE — Assessment & Plan Note (Signed)
Has not had flare since last summer. Uses singulair, symbicort, albuterol prn. No flare today.

## 2015-07-05 NOTE — Assessment & Plan Note (Signed)
Checking lipid panel today and adjust as needed. No medicine for now. Tries to exercise and reasonable diet.

## 2015-07-06 LAB — CBC
HEMATOCRIT: 46.3 % (ref 39.0–52.0)
HEMOGLOBIN: 15.3 g/dL (ref 13.0–17.0)
MCHC: 33 g/dL (ref 30.0–36.0)
MCV: 91.7 fl (ref 78.0–100.0)
PLATELETS: 366 10*3/uL (ref 150.0–400.0)
RBC: 5.05 Mil/uL (ref 4.22–5.81)
RDW: 14.1 % (ref 11.5–15.5)
WBC: 9.8 10*3/uL (ref 4.0–10.5)

## 2015-07-06 LAB — HEPATITIS C ANTIBODY: HCV AB: NEGATIVE

## 2015-09-06 ENCOUNTER — Other Ambulatory Visit: Payer: Self-pay | Admitting: Internal Medicine

## 2015-09-09 NOTE — Telephone Encounter (Signed)
Sent to pharmacy 

## 2015-09-10 ENCOUNTER — Other Ambulatory Visit: Payer: Self-pay | Admitting: *Deleted

## 2015-09-10 MED ORDER — RANITIDINE HCL 300 MG PO TABS: 300.0000 mg | ORAL_TABLET | Freq: Every day | ORAL | Status: AC

## 2015-09-18 IMAGING — CR DG CHEST 2V
2 series · 2 of 2 positions shown · non-contrast
Comparison: Chest radiographs 08/04/2010 and earlier.

CLINICAL DATA: 59-year-old male with suspected acute bronchitis.
Cough congestion and abnormal breathing. Intermittent fever for 2
weeks. Recent antibiotic and steroid course without relief. Initial
encounter.

EXAM:
CHEST  2 VIEW

[view not recorded (1 of 2)]
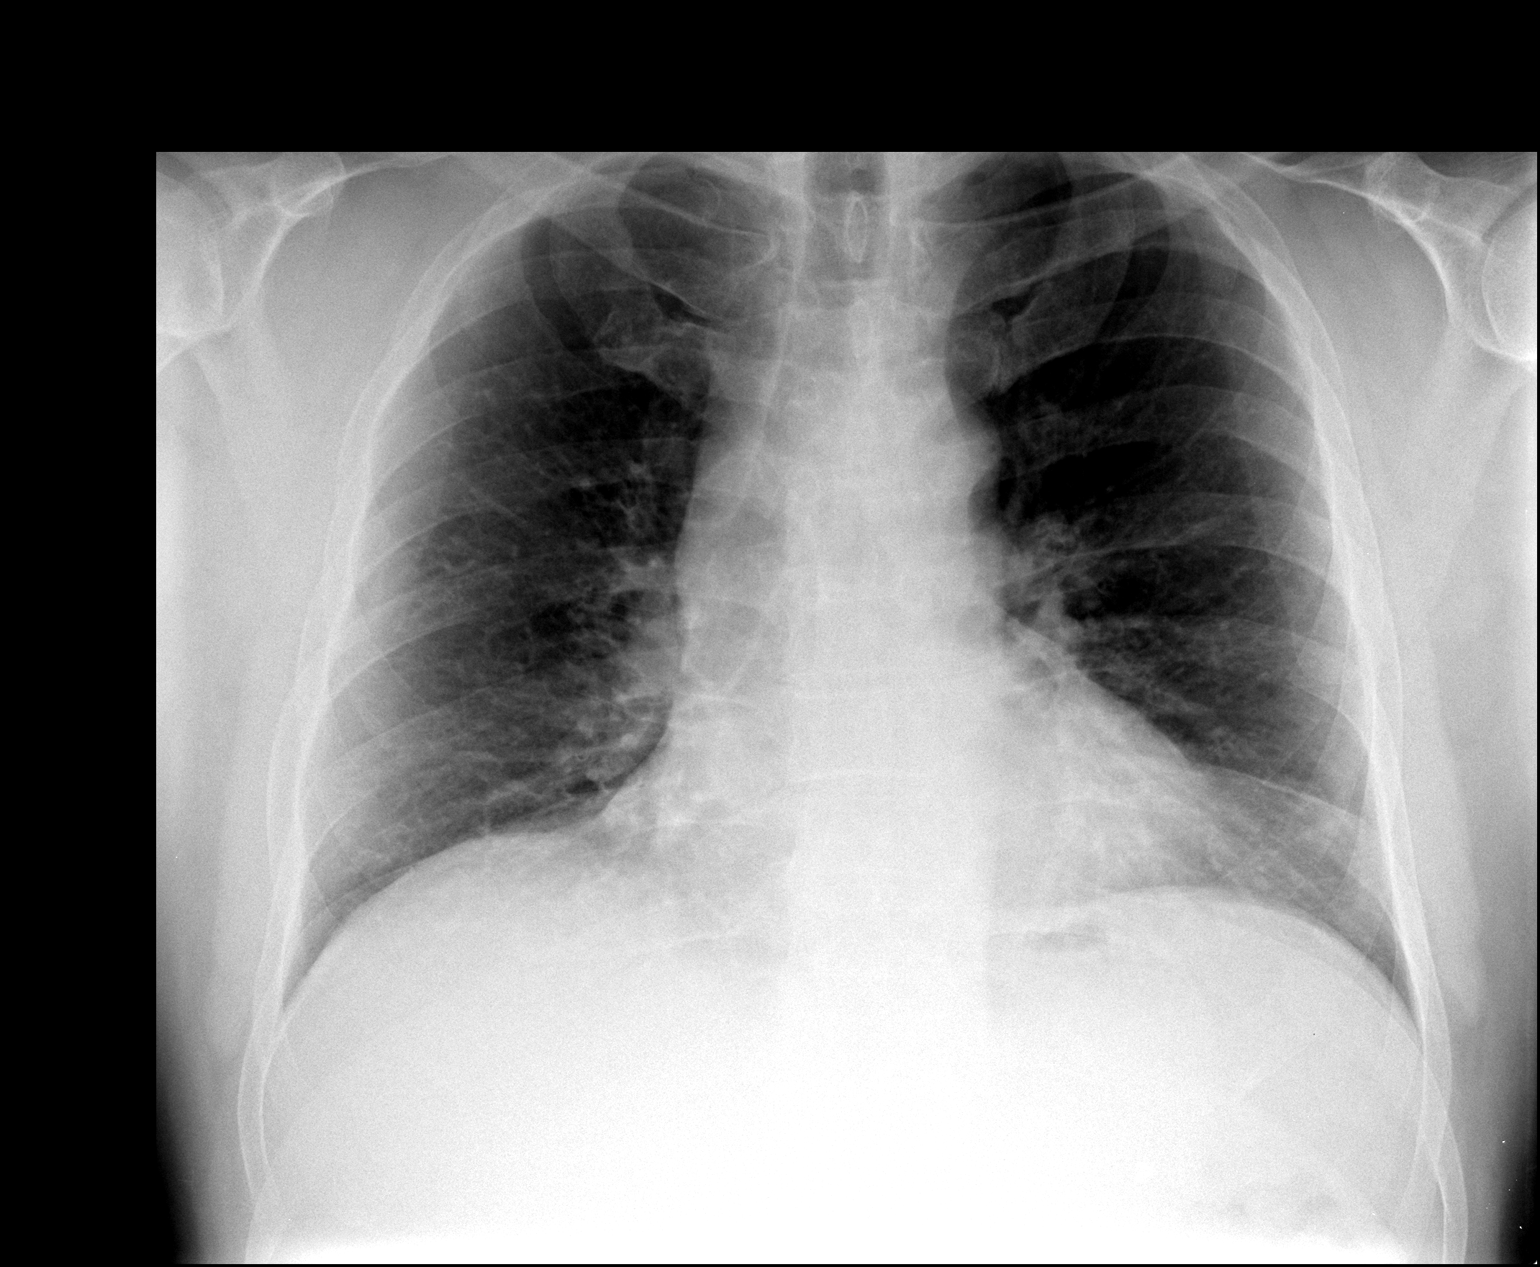

[view not recorded (2 of 2)]
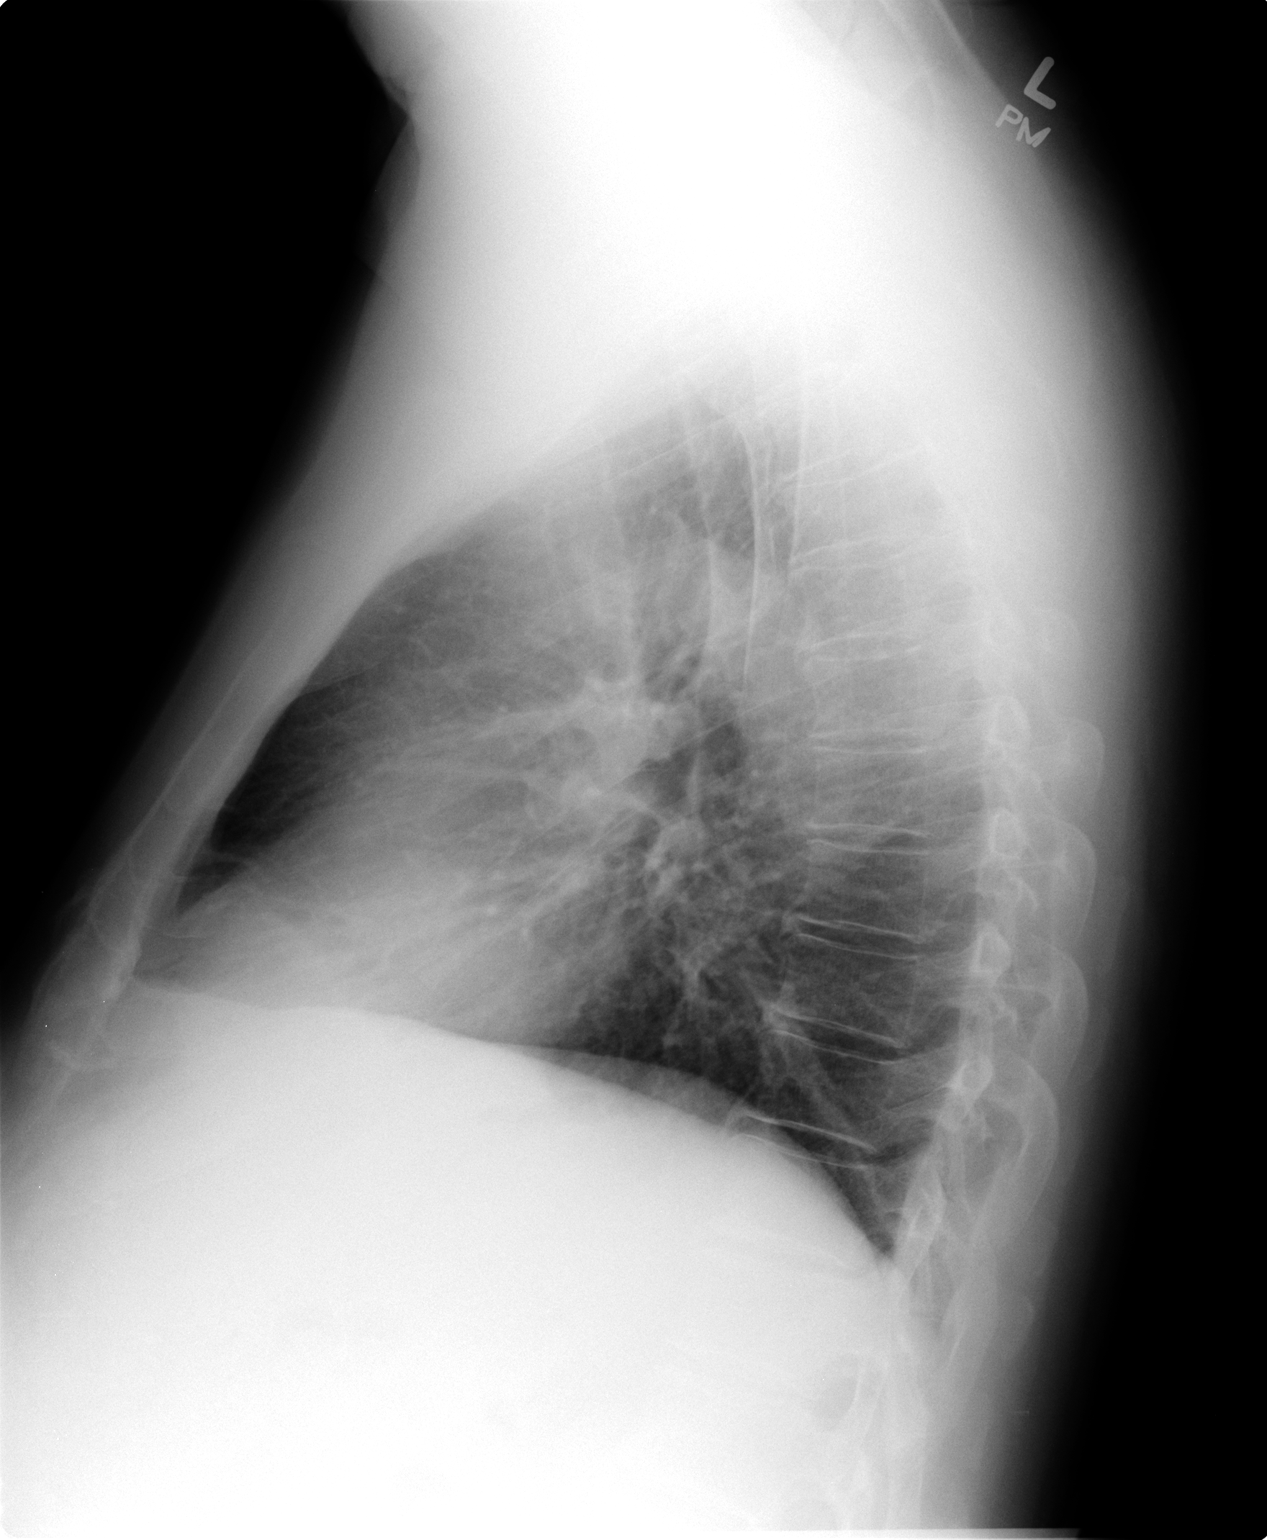

[2 of 2 positions shown; findings below may reference images not displayed]

FINDINGS: Mildly lower lung volumes today. Mild crowding of markings most
apparent at the bases. No consolidation or pleural effusion. No
pneumothorax or pulmonary edema. No definite acute pulmonary
opacity. Normal cardiac size and mediastinal contours. Visualized
tracheal air column is within normal limits. No acute osseous
abnormality identified.
IMPRESSION: Lower lung volumes, otherwise no acute cardiopulmonary abnormality.

## 2015-10-28 ENCOUNTER — Other Ambulatory Visit: Payer: Self-pay | Admitting: Internal Medicine

## 2015-10-29 ENCOUNTER — Other Ambulatory Visit: Payer: Self-pay | Admitting: *Deleted

## 2015-10-29 DIAGNOSIS — J683 Other acute and subacute respiratory conditions due to chemicals, gases, fumes and vapors: Secondary | ICD-10-CM

## 2015-10-29 MED ORDER — PANTOPRAZOLE SODIUM 40 MG PO TBEC: 40.0000 mg | DELAYED_RELEASE_TABLET | Freq: Every day | ORAL | Status: AC

## 2015-10-29 MED ORDER — BUDESONIDE-FORMOTEROL FUMARATE 160-4.5 MCG/ACT IN AERO: 2.0000 | INHALATION_SPRAY | Freq: Two times a day (BID) | RESPIRATORY_TRACT | Status: DC

## 2015-11-13 ENCOUNTER — Other Ambulatory Visit: Payer: Self-pay | Admitting: *Deleted

## 2015-11-13 MED ORDER — MONTELUKAST SODIUM 10 MG PO TABS: 10.0000 mg | ORAL_TABLET | Freq: Every day | ORAL | Status: AC

## 2016-01-17 ENCOUNTER — Other Ambulatory Visit: Payer: Self-pay | Admitting: Internal Medicine

## 2016-01-17 ENCOUNTER — Other Ambulatory Visit: Payer: Self-pay | Admitting: Allergy and Immunology

## 2016-01-17 DIAGNOSIS — J683 Other acute and subacute respiratory conditions due to chemicals, gases, fumes and vapors: Secondary | ICD-10-CM

## 2016-02-18 ENCOUNTER — Other Ambulatory Visit: Payer: Self-pay | Admitting: Internal Medicine

## 2016-02-18 DIAGNOSIS — J683 Other acute and subacute respiratory conditions due to chemicals, gases, fumes and vapors: Secondary | ICD-10-CM
